# Patient Record
Sex: Female | Born: 1975 | Race: White | Hispanic: No | Marital: Married | Smoking: Never smoker
Health system: Southern US, Community
[De-identification: ages and names within clinical notes are randomized; demographics above are authoritative.]

## PROBLEM LIST (undated history)

## (undated) ENCOUNTER — Emergency Department (HOSPITAL_COMMUNITY): Disposition: A | Discharge status: Home / Self Care | Payer: BC Managed Care – PPO

## (undated) DIAGNOSIS — J189 Pneumonia, unspecified organism: Secondary | ICD-10-CM

## (undated) DIAGNOSIS — R112 Nausea with vomiting, unspecified: Secondary | ICD-10-CM

## (undated) DIAGNOSIS — Z8489 Family history of other specified conditions: Secondary | ICD-10-CM

## (undated) DIAGNOSIS — F419 Anxiety disorder, unspecified: Secondary | ICD-10-CM

## (undated) DIAGNOSIS — N6452 Nipple discharge: Secondary | ICD-10-CM

## (undated) DIAGNOSIS — Z98811 Dental restoration status: Secondary | ICD-10-CM

## (undated) DIAGNOSIS — Z9889 Other specified postprocedural states: Secondary | ICD-10-CM

## (undated) HISTORY — PX: APPENDECTOMY: SHX54

## (undated) HISTORY — PX: CHOLECYSTECTOMY: SHX55

## (undated) HISTORY — PX: ABDOMINAL HYSTERECTOMY: SHX81

---

## 1998-03-12 ENCOUNTER — Other Ambulatory Visit: Admission: RE | Admit: 1998-03-12 | Discharge: 1998-03-12 | Payer: Self-pay | Admitting: *Deleted

## 1999-05-04 ENCOUNTER — Other Ambulatory Visit: Admission: RE | Admit: 1999-05-04 | Discharge: 1999-05-04 | Payer: Self-pay | Admitting: *Deleted

## 1999-10-21 ENCOUNTER — Other Ambulatory Visit: Admission: RE | Admit: 1999-10-21 | Discharge: 1999-10-21 | Payer: Self-pay | Admitting: *Deleted

## 2005-08-09 HISTORY — PX: APPENDECTOMY: SHX54

## 2008-01-26 ENCOUNTER — Inpatient Hospital Stay (HOSPITAL_COMMUNITY): Admission: AD | Admit: 2008-01-26 | Discharge: 2008-01-28 | Payer: Self-pay | Admitting: Obstetrics and Gynecology

## 2009-08-14 ENCOUNTER — Ambulatory Visit (HOSPITAL_COMMUNITY): Admission: RE | Admit: 2009-08-14 | Discharge: 2009-08-14 | Payer: Self-pay | Admitting: Obstetrics and Gynecology

## 2009-08-14 HISTORY — PX: TUBAL LIGATION: SHX77

## 2009-08-14 HISTORY — PX: DILATION AND EVACUATION: SHX1459

## 2010-08-09 DIAGNOSIS — C801 Malignant (primary) neoplasm, unspecified: Secondary | ICD-10-CM

## 2010-08-09 HISTORY — DX: Malignant (primary) neoplasm, unspecified: C80.1

## 2010-10-25 LAB — CBC
MCHC: 34.6 g/dL (ref 30.0–36.0)
MCV: 88.4 fL (ref 78.0–100.0)
Platelets: 276 10*3/uL (ref 150–400)

## 2011-05-06 LAB — CBC
Hemoglobin: 11.6 — ABNORMAL LOW
Platelets: 267
RBC: 4.03
RDW: 14.9
RDW: 14.9
WBC: 9.8

## 2011-05-06 LAB — RPR: RPR Ser Ql: NONREACTIVE

## 2011-11-22 ENCOUNTER — Encounter (HOSPITAL_COMMUNITY): Payer: Self-pay

## 2011-11-22 ENCOUNTER — Emergency Department (HOSPITAL_COMMUNITY)
Admission: EM | Admit: 2011-11-22 | Discharge: 2011-11-22 | Disposition: A | Discharge status: Home / Self Care | Payer: BC Managed Care – PPO | Attending: Emergency Medicine | Admitting: Emergency Medicine

## 2011-11-22 DIAGNOSIS — Z9889 Other specified postprocedural states: Secondary | ICD-10-CM | POA: Insufficient documentation

## 2011-11-22 DIAGNOSIS — L0390 Cellulitis, unspecified: Secondary | ICD-10-CM

## 2011-11-22 DIAGNOSIS — L02419 Cutaneous abscess of limb, unspecified: Secondary | ICD-10-CM | POA: Insufficient documentation

## 2011-11-22 LAB — BASIC METABOLIC PANEL
BUN: 8 mg/dL (ref 6–23)
CO2: 23 mEq/L (ref 19–32)
Chloride: 106 mEq/L (ref 96–112)
GFR calc non Af Amer: >90 mL/min (ref >90)
Glucose, Bld: 75 mg/dL (ref 70–99)
Potassium: 3.4 mEq/L — ABNORMAL LOW (ref 3.5–5.1)
Sodium: 142 mEq/L (ref 135–145)

## 2011-11-22 LAB — DIFFERENTIAL
Eosinophils Absolute: 0.2 10*3/uL (ref 0.0–0.7)
Lymphocytes Relative: 30 % (ref 12–46)
Lymphs Abs: 2 10*3/uL (ref 0.7–4.0)
Monocytes Relative: 5 % (ref 3–12)
Neutrophils Relative %: 61 % (ref 43–77)

## 2011-11-22 LAB — CBC
Hemoglobin: 13.4 g/dL (ref 12.0–15.0)
MCH: 29.5 pg (ref 26.0–34.0)
RBC: 4.55 MIL/uL (ref 3.87–5.11)
WBC: 6.5 10*3/uL (ref 4.0–10.5)

## 2011-11-22 MED ORDER — DOXYCYCLINE HYCLATE 100 MG PO CAPS: 100.0000 mg | ORAL_CAPSULE | Freq: Two times a day (BID) | ORAL | Status: AC

## 2011-11-22 MED ORDER — OXYCODONE-ACETAMINOPHEN 5-325 MG PO TABS
1.0000 | ORAL_TABLET | Freq: Once | ORAL | Status: AC
  Administered 2011-11-22: 1 via ORAL
  Filled 2011-11-22: qty 1

## 2011-11-22 MED ORDER — OXYCODONE-ACETAMINOPHEN 5-325 MG PO TABS: 1.0000 | ORAL_TABLET | ORAL | Status: AC | PRN

## 2011-11-22 NOTE — ED Notes (Signed)
Patient presents with cellulitis to left leg that has been present for several weeks. 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 Patient reports the cellulitis is now oozing from the bandage.

## 2011-11-22 NOTE — Discharge Instructions (Signed)
Cellulitis Cellulitis is an infection of the tissue under the skin. The infected area is usually red and tender. This is caused by germs. These germs enter the body through cuts or sores. This usually happens in the arms or lower legs. HOME CARE   Take your medicine as told. Finish it even if you start to feel better.   If the infection is on the arm or leg, keep it raised (elevated).   Use a warm cloth on the infected area several times a day.   See your doctor for a follow-up visit as told.  GET HELP RIGHT AWAY IF:   You are tired or confused.   You throw up (vomit).   You have watery poop (diarrhea).   You feel ill and have muscle aches.   You have a fever.  MAKE SURE YOU:   Understand these instructions.   Will watch your condition.   Will get help right away if you are not doing well or get worse.  Document Released: 01/12/2008 Document Revised: 07/15/2011 Document Reviewed: 06/27/2009 Exit[employer] Patient Information 2012 [employer]. 

## 2011-11-22 NOTE — ED Notes (Signed)
Pt states that she had a sot of  Skin cancer removed on 10/14/11 dr Orvan Falconer in [place] and developed cellulitis has been taking antibiotics and was wrapped in antibitotic gauze which she had appointment to have taken off this afternoon. But pt states that she has  Started to feel bad having h/a and stomach feels bad

## 2011-11-22 NOTE — ED Provider Notes (Signed)
History     CSN: [id number]  Arrival date & time 11/22/11  1038   First MD Initiated Contact with Patient 11/22/11 1144      Chief Complaint  Patient presents with  . Cellulitis    The history is provided by the patient.  cellulitis Onset - several weeks ago Duration - constant Course - unchanged Worsened by - palpation Improved by - rest Associated symptoms - denies fever  Pt had melanoma surgical removed from left calf over one month ago.  One week ago ,she had the wound reopened due to "stitches not absorbed" by the dermatologist, and she has been on bactrim intermittently for past month Today, she reports she noticed yellowish drainage from the wound No redness reported She is able to ambulate No vomiting No other complaints at this time  Past Medical History  Diagnosis Date  . Cellulitis     Past Surgical History  Procedure Date  . Appendectomy   . Cholecystectomy   . Tubal ligation   . Melanoma excision to left calf    No family history on file.  History  Substance Use Topics  . Smoking status: Never Smoker   . Smokeless tobacco: Not on file  . Alcohol Use: No    OB History    Grav Para Term Preterm Abortions TAB SAB Ect Mult Living                  Review of Systems  Constitutional: Negative for fever.  Gastrointestinal: Negative for vomiting.  All other systems reviewed and are negative.    Allergies  Codeine  Home Medications   Current Outpatient Rx  Name Route Sig Dispense Refill  . FLUOXETINE HCL 20 MG PO CAPS Oral Take 20 mg by mouth daily.    Marland Kitchen DOXYCYCLINE HYCLATE 100 MG PO CAPS Oral Take 1 capsule (100 mg total) by mouth 2 (two) times daily. 10 capsule 0  . OXYCODONE-ACETAMINOPHEN 5-325 MG PO TABS Oral Take 1 tablet by mouth every 4 (four) hours as needed for pain. 10 tablet 0    BP 134/83  Pulse 74  Temp 97.7 F (36.5 C)  Resp 18  SpO2 100%  LMP 11/15/2011  Physical Exam CONSTITUTIONAL: Well developed/well  nourished HEAD AND FACE: Normocephalic/atraumatic EYES: EOMI ENMT: Mucous membranes moist NECK: supple no meningeal signs LUNGS, no apparent distress ABDOMEN: soft NEURO: Pt is awake/alert, moves all extremitiesx4 EXTREMITIES: pulses normal, full ROM SKIN: warm, color normal.  She has a wound noted to left calf midway down limb.  There is no surrounding erythema/drainage/crepitance noted.  The wound is well healed with appropriate granulation tissue noted surrounding wound.  No LE edema is noted.   PSYCH: no abnormalities of mood noted  ED Course  Procedures   Labs Reviewed  BASIC METABOLIC PANEL - Abnormal; Notable for the following:    Potassium 3.4 (*)    All other components within normal limits  CBC  DIFFERENTIAL  WOUND CULTURE     1. Cellulitis    Pt well appearing, no signs of worsening cellulitis No complicating abscess noted She was supposed to see dermatologist today but got nervous due to yellowish drainage (none noted in the ED) and so she came to ED Advised to stop bactrim, will start  5 days of doxycycline.  She will see her dermatologist later this week   MDM  Nursing notes reviewed and considered in documentation All labs/vitals reviewed and considered  Joya Gaskins, MD 11/22/11 1401

## 2011-11-25 LAB — WOUND CULTURE: Culture: NO GROWTH

## 2012-02-28 ENCOUNTER — Other Ambulatory Visit: Payer: Self-pay | Admitting: Obstetrics and Gynecology

## 2012-03-16 ENCOUNTER — Other Ambulatory Visit: Payer: Self-pay | Admitting: Dermatology

## 2013-08-23 ENCOUNTER — Other Ambulatory Visit: Payer: Self-pay | Admitting: Obstetrics and Gynecology

## 2014-09-05 ENCOUNTER — Other Ambulatory Visit: Payer: Self-pay | Admitting: Obstetrics and Gynecology

## 2014-09-06 LAB — CYTOLOGY - PAP

## 2015-10-30 ENCOUNTER — Other Ambulatory Visit: Payer: Self-pay | Admitting: Obstetrics and Gynecology

## 2015-10-31 LAB — CYTOLOGY - PAP

## 2016-11-10 ENCOUNTER — Ambulatory Visit (INDEPENDENT_AMBULATORY_CARE_PROVIDER_SITE_OTHER): Payer: BC Managed Care – PPO | Admitting: Orthopaedic Surgery

## 2016-11-10 ENCOUNTER — Encounter (INDEPENDENT_AMBULATORY_CARE_PROVIDER_SITE_OTHER): Payer: Self-pay | Admitting: Orthopaedic Surgery

## 2016-11-10 VITALS — BP 110/68 | HR 72 | Resp 14 | Ht 69.0 in | Wt 225.0 lb

## 2016-11-10 DIAGNOSIS — R2231 Localized swelling, mass and lump, right upper limb: Secondary | ICD-10-CM | POA: Diagnosis not present

## 2016-11-10 NOTE — Progress Notes (Signed)
   Office Visit Note   Patient: Sandra Vargas           Date of Birth: 06/28/1976           MRN: [id number] Visit Date: 11/10/2016              Requested by: No referring provider defined for this encounter. PCP: Default, Provider, MD   Assessment & Plan: Visit Diagnoses: Mass dorsum of right distal forearm/ wrist  Plan: Long discussion regarding potential diagnosis of the mass. Treatment options are to watch it a little longer or consider surgical excision. After much discussion the patient would prefer to have it excised. I believe the mass is solid and could represent a foreign body reaction versus benign tumor  Follow-Up Instructions: No Follow-up on file.   Orders:  No orders of the defined types were placed in this encounter.  No orders of the defined types were placed in this encounter.     Procedures: No procedures performed   Clinical Data: No additional findings.   Subjective: Chief Complaint  Patient presents with  . Right Wrist - Cyst     Sandra Vargas is a 41 year old female that presents with a cyst on the top of the right wrist. She relates the cyst has been there about 3 weeks and is getting larger and hurts more. Denies injury, fever or a bite.  Review of Systems  no history of insect bite, splinter or other foreign body. Denies numbness or tingling.   Physical Exam  Ortho Exam 5-6 mm mass is localized on the dorsum of the right distal forearm and wrist. It appears to be solid rather than cystic. Slightly red and tender to touch. Negative Tinel's .not associated with deeper tendons. Mass appears to be superficial .  Specialty Comments:  No specialty comments available.  Imaging: No results found.   PMFS History: There are no active problems to display for this patient.  Past Medical History:  Diagnosis Date  . Cellulitis     No family history on file.  Past Surgical History:  Procedure Laterality Date  . APPENDECTOMY    .  CHOLECYSTECTOMY    . MELANOMA EXCISION  to left calf  . TUBAL LIGATION     Social History   Occupational History  . Not on file.   Social History Main Topics  . Smoking status: Never Smoker  . Smokeless tobacco: Not on file  . Alcohol use No  . Drug use: No  . Sexual activity: Not on file

## 2016-11-18 ENCOUNTER — Other Ambulatory Visit: Payer: Self-pay

## 2016-11-18 ENCOUNTER — Other Ambulatory Visit: Payer: Self-pay | Admitting: Obstetrics and Gynecology

## 2016-11-18 DIAGNOSIS — R2231 Localized swelling, mass and lump, right upper limb: Secondary | ICD-10-CM | POA: Diagnosis not present

## 2016-11-18 HISTORY — PX: WRIST MASS EXCISION: SHX2674

## 2016-11-19 ENCOUNTER — Other Ambulatory Visit: Payer: Self-pay | Admitting: Obstetrics and Gynecology

## 2016-11-19 DIAGNOSIS — N6452 Nipple discharge: Secondary | ICD-10-CM

## 2016-11-19 LAB — CYTOLOGY - PAP

## 2016-11-24 ENCOUNTER — Ambulatory Visit (INDEPENDENT_AMBULATORY_CARE_PROVIDER_SITE_OTHER): Payer: BC Managed Care – PPO | Admitting: Orthopaedic Surgery

## 2016-11-24 ENCOUNTER — Encounter (INDEPENDENT_AMBULATORY_CARE_PROVIDER_SITE_OTHER): Payer: Self-pay | Admitting: Orthopaedic Surgery

## 2016-11-24 VITALS — BP 170/95 | HR 61 | Ht 69.0 in | Wt 225.0 lb

## 2016-11-24 DIAGNOSIS — R2231 Localized swelling, mass and lump, right upper limb: Secondary | ICD-10-CM

## 2016-11-24 NOTE — Progress Notes (Signed)
   Office Visit Note   Patient: Sandra Vargas           Date of Birth: 07/02/1976           MRN: [id number] Visit Date: 11/24/2016              Requested by: No referring provider defined for this encounter. PCP: Default, Provider, MD   Assessment & Plan: Visit Diagnoses:  1. Mass of right wrist   6 days status post excision of mass from the dorsum of the right wrist. She report consistent with a pilomatricoma.  Plan: Activity as tolerated with right hand and wrist, office 2 weeks. No splinting.  Follow-Up Instructions: Return in about 2 weeks (around 12/08/2016).   Orders:  No orders of the defined types were placed in this encounter.  No orders of the defined types were placed in this encounter.     Procedures: No procedures performed   Clinical Data: No additional findings.   Subjective: Chief Complaint  Patient presents with  . Right Wrist - Routine Post Op, Cyst  Sandra Vargas is 6 days status post excision of a mass from her right wrist. 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 She is doing well without any complaints  HPI  Review of Systems   Objective: Vital Signs: BP (!) 170/95   Pulse 61   Ht 5\' 9"  (1.753 m)   Wt 225 lb (102.1 kg)   BMI 33.23 kg/m   Physical Exam  Ortho Exam right wrist exam without evidence of bleeding or drainage. No redness. Neurovascular exam intact.  Specialty Comments:  No specialty comments available.  Imaging: No results found.   PMFS History: There are no active problems to display for this patient.  Past Medical History:  Diagnosis Date  . Cellulitis     History reviewed. No pertinent family history.  Past Surgical History:  Procedure Laterality Date  . APPENDECTOMY    . CHOLECYSTECTOMY    . MELANOMA EXCISION  to left calf  . TUBAL LIGATION     Social History   Occupational History  . Not on file.   Social History Main Topics  . Smoking status: Never Smoker  .  Smokeless tobacco: Never Used  . Alcohol use No  . Drug use: No  . Sexual activity: Yes    Birth control/ protection: IUD

## 2016-11-26 ENCOUNTER — Ambulatory Visit
Admission: RE | Admit: 2016-11-26 | Discharge: 2016-11-26 | Disposition: A | Discharge status: Home / Self Care | Payer: BC Managed Care – PPO | Source: Ambulatory Visit | Attending: Obstetrics and Gynecology | Admitting: Obstetrics and Gynecology

## 2016-11-26 DIAGNOSIS — N6452 Nipple discharge: Secondary | ICD-10-CM

## 2016-12-08 ENCOUNTER — Inpatient Hospital Stay (INDEPENDENT_AMBULATORY_CARE_PROVIDER_SITE_OTHER): Payer: BC Managed Care – PPO | Admitting: Orthopaedic Surgery

## 2016-12-15 ENCOUNTER — Ambulatory Visit (INDEPENDENT_AMBULATORY_CARE_PROVIDER_SITE_OTHER): Payer: BC Managed Care – PPO | Admitting: Orthopaedic Surgery

## 2016-12-15 ENCOUNTER — Encounter (INDEPENDENT_AMBULATORY_CARE_PROVIDER_SITE_OTHER): Payer: Self-pay | Admitting: Orthopaedic Surgery

## 2016-12-15 DIAGNOSIS — L989 Disorder of the skin and subcutaneous tissue, unspecified: Secondary | ICD-10-CM | POA: Insufficient documentation

## 2016-12-15 NOTE — Progress Notes (Signed)
   Office Visit Note   Patient: Sandra Vargas           Date of Birth: 05-28-1976           MRN: [id number] Visit Date: 12/15/2016              Requested by: No referring provider defined for this encounter. PCP: Default, Provider, MD   Assessment & Plan: Visit Diagnoses:  1. Skin lesion of right arm     Plan: #1: Continue to protect this area from the sun #2: Use of mederma cream or vitamin E cream   Follow-Up Instructions: Return if symptoms worsen or fail to improve.   Orders:  No orders of the defined types were placed in this encounter.  No orders of the defined types were placed in this encounter.     Procedures: No procedures performed   Clinical Data: No additional findings.   Subjective: Chief Complaint  Patient presents with  . Right Wrist - Routine Post Op    Sandra Vargas is a 41 y o status post Right wrist surgery, removal of cyst. She relates she has tenderness and numbness at the incision site. No fevers or chill    Sandra Vargas is 41 year old white female who is now 4 weeks status post excision of a lesion of her right radial portion of her wrist. She is doing well. Denies any pain at this time. Very happy with the results.    Review of Systems   Objective: Vital Signs: BP 124/85   Pulse 85   Ht 5\' 10"  (1.778 m)   Wt 225 lb (102.1 kg)   BMI 32.28 kg/m   Physical Exam  Ortho Exam  Wound is well-healed. Still some puckering from the subcutaneous stitches. There is flattening though in the central portion. No signs of infection.  Specialty Comments:  No specialty comments available.  Imaging: No results found.   PMFS History: Patient Active Problem List   Diagnosis Date Noted  . Skin lesion of right arm 12/15/2016   Past Medical History:  Diagnosis Date  . Cellulitis     Family History  Problem Relation Age of Onset  . Breast cancer Maternal Aunt     Past Surgical History:  Procedure Laterality Date  . APPENDECTOMY      . CHOLECYSTECTOMY    . MELANOMA EXCISION  to left calf  . TUBAL LIGATION     Social History   Occupational History  . Not on file.   Social History Main Topics  . Smoking status: Never Smoker  . Smokeless tobacco: Never Used  . Alcohol use No  . Drug use: No  . Sexual activity: Yes    Birth control/ protection: IUD

## 2017-12-13 ENCOUNTER — Other Ambulatory Visit: Payer: Self-pay | Admitting: Obstetrics and Gynecology

## 2017-12-13 DIAGNOSIS — N6452 Nipple discharge: Secondary | ICD-10-CM

## 2017-12-23 ENCOUNTER — Ambulatory Visit: Admission: RE | Admit: 2017-12-23 | Payer: BC Managed Care – PPO | Source: Ambulatory Visit

## 2017-12-23 ENCOUNTER — Ambulatory Visit
Admission: RE | Admit: 2017-12-23 | Discharge: 2017-12-23 | Disposition: A | Discharge status: Home / Self Care | Payer: BC Managed Care – PPO | Source: Ambulatory Visit | Attending: Obstetrics and Gynecology | Admitting: Obstetrics and Gynecology

## 2017-12-23 DIAGNOSIS — N6452 Nipple discharge: Secondary | ICD-10-CM

## 2018-01-03 ENCOUNTER — Ambulatory Visit: Payer: Self-pay | Admitting: Surgery

## 2018-01-03 NOTE — H&P (Signed)
Franco Collet Documented: 01/03/2018 10:18 AM Location: [facility] Patient #: [id number] DOB: 17-Jul-1976 Married / Language: Sandra Vargas / Race: White Female  History of Present Illness Marcello Moores A. Raeghan Demeter MD; 01/03/2018 10:46 AM) Patient words: Patient sent at the request of Dr. Radford Pax due to right breast nipple discharge which is clear 1 year. The patient states his discharge is moderate in intensity. The patient denies any pain and its clear nonbloody. Patient denies any mass, pain, redness, fever, or chills associated with the right nipple discharge. There is no left breast mass or left nipple discharge. She had 2 maternal aunts with breast cancer in their 33s. She has had no previous breast biopsies.  The patient is a 42 year old female.   Past Surgical History ([name], [place]; 01/03/2018 10:18 AM) Appendectomy Foot Surgery Right. Gallbladder Surgery - Laparoscopic Oral Surgery  Diagnostic Studies History ([name], [place]; 01/03/2018 10:18 AM) Colonoscopy never Mammogram within last year Pap Smear 1-5 years ago  Allergies ([name], [place]; 01/03/2018 10:19 AM) Codeine Phosphate *ANALGESICS - OPIOID* Nausea, Vomiting. Allergies Reconciled  Medication History ([name], [place]; 01/03/2018 10:19 AM) Phentermine HCl (37.5MG  Capsule, Oral) Active. FLUoxetine HCl (20MG  Capsule, Oral) Active. Mirena (52 MG) (20MCG/24HR IUD, Intrauterine) Active. Medications Reconciled  Pregnancy / Birth History ([name], RMA; 01/03/2018 10:18 AM) Age at menarche 89 years. Contraceptive History Intrauterine device. Gravida 4 Irregular periods Maternal age 68-25 Para 72  Other Problems ([name], [place]; 01/03/2018 10:18 AM) Anxiety Disorder Back Pain Depression Melanoma     Review of Systems ([name]; 01/03/2018 10:18 AM) General Not Present- Appetite Loss, Chills, Fatigue, Fever, Night Sweats, Weight  Gain and Weight Loss. Skin Not Present- Change in Wart/Mole, Dryness, Hives, Jaundice, New Lesions, Non-Healing Wounds, Rash and Ulcer. HEENT Present- Wears glasses/contact lenses. Not Present- Earache, Hearing Loss, Hoarseness, Nose Bleed, Oral Ulcers, Ringing in the Ears, Seasonal Allergies, Sinus Pain, Sore Throat, Visual Disturbances and Yellow Eyes. Respiratory Not Present- Bloody sputum, Chronic Cough, Difficulty Breathing, Snoring and Wheezing. Breast Present- Nipple Discharge. Not Present- Breast Mass, Breast Pain and Skin Changes. Cardiovascular Not Present- Chest Pain, Difficulty Breathing Lying Down, Leg Cramps, Palpitations, Rapid Heart Rate, Shortness of Breath and Swelling of Extremities. Gastrointestinal Not Present- Abdominal Pain, Bloating, Bloody Stool, Change in Bowel Habits, Chronic diarrhea, Constipation, Difficulty Swallowing, Excessive gas, Gets full quickly at meals, Hemorrhoids, Indigestion, Nausea, Rectal Pain and Vomiting. Female Genitourinary Not Present- Frequency, Nocturia, Painful Urination, Pelvic Pain and Urgency. Musculoskeletal Not Present- Back Pain, Joint Pain, Joint Stiffness, Muscle Pain, Muscle Weakness and Swelling of Extremities. Neurological Not Present- Decreased Memory, Fainting, Headaches, Numbness, Seizures, Tingling, Tremor, Trouble walking and Weakness. Psychiatric Present- Anxiety and Depression. Not Present- Bipolar, Change in Sleep Pattern, Fearful and Frequent crying. Endocrine Not Present- Cold Intolerance, Excessive Hunger, Hair Changes, Heat Intolerance, Hot flashes and New Diabetes. Hematology Not Present- Blood Thinners, Easy Bruising, Excessive bleeding, Gland problems, HIV and Persistent Infections.  Vitals ([name]; 01/03/2018 10:19 AM) 01/03/2018 10:18 AM Weight: 222 lb Height: 69in Body Surface Area: 2.16 m Body Mass Index: 32.78 kg/m  Temp.: 98.68F  Pulse: 104 (Regular)  BP: 126/84 (Sitting, Left Arm,  Standard)      Physical Exam (Deslyn Cavenaugh A. Brace Welte MD; 01/03/2018 10:47 AM)  General Mental Status-Alert. General Appearance-Consistent with stated age. Hydration-Well hydrated. Voice-Normal.  Head and Neck Head-normocephalic, atraumatic with no lesions or palpable masses. Trachea-midline. Thyroid Gland Characteristics - normal size and consistency.  Eye Eyeball - Bilateral-Extraocular movements intact. Sclera/Conjunctiva - Bilateral-No scleral icterus.  Chest and Lung Exam Chest and lung exam reveals -quiet, even and easy respiratory effort with no use of accessory muscles and on auscultation, normal breath sounds, no adventitious sounds and normal vocal resonance. Inspection Chest Wall - Normal. Back - normal.  Breast Breast - Left-Symmetric, Non Tender, No Biopsy scars, no Dimpling, No Inflammation, No Lumpectomy scars, No Mastectomy scars, No Peau d' Orange. Breast - Right-Symmetric, Non Tender, No Biopsy scars, no Dimpling, No Inflammation, No Lumpectomy scars, No Mastectomy scars, No Peau d' Orange. Breast Lump-No Palpable Breast Mass.  Cardiovascular Cardiovascular examination reveals -normal heart sounds, regular rate and rhythm with no murmurs and normal pedal pulses bilaterally.  Abdomen Inspection Inspection of the abdomen reveals - No Hernias. Skin - Scar - no surgical scars. Palpation/Percussion Palpation and Percussion of the abdomen reveal - Soft, Non Tender, No Rebound tenderness, No Rigidity (guarding) and No hepatosplenomegaly. Auscultation Auscultation of the abdomen reveals - Bowel sounds normal.  Neurologic Neurologic evaluation reveals -alert and oriented x 3 with no impairment of recent or remote memory. Mental Status-Normal.  Musculoskeletal Normal Exam - Left-Upper Extremity Strength Normal and Lower Extremity Strength Normal. Normal Exam - Right-Upper Extremity Strength Normal and Lower Extremity Strength  Normal.  Lymphatic Head & Neck  General Head & Neck Lymphatics: Bilateral - Description - Normal. Axillary  General Axillary Region: Bilateral - Description - Normal. Tenderness - Non Tender. Femoral & Inguinal  Generalized Femoral & Inguinal Lymphatics: Bilateral - Description - Normal. Tenderness - Non Tender.    Assessment & Plan (Ashlin Hidalgo A. Lossie Kalp MD; 01/03/2018 10:47 AM)  DISCHARGE FROM RIGHT NIPPLE (N64.52) Impression: MRI to complete evaluation given family history   she will consider central duct excsion on the right Risk of lumpectomy include bleeding, infection, seroma, more surgery, nipple loss/ numbness wound care, cosmetic deformity and the need for other treatments, death , blood clots, death. Pt agrees to proceed.  Current Plans The anatomy and the physiology was discussed. The pathophysiology and natural history of the disease was discussed. Options were discussed and recommendations were made. Technique, risks, benefits, & alternatives were discussed. Risks such as stroke, heart attack, bleeding, indection, death, and other risks discussed. Questions answered. The patient agrees to proceed. Pt Education - [facility] Free Text Education/Instructions: discussed with patient and provided information.  FAMILY HISTORY OF BREAST CANCER (Z80.3) Impression: she has a lifetime risk of 20 percent of developing breast cancer may bebefit from high risk clinic

## 2018-01-09 ENCOUNTER — Other Ambulatory Visit: Payer: Self-pay | Admitting: Surgery

## 2018-01-09 DIAGNOSIS — N6452 Nipple discharge: Secondary | ICD-10-CM

## 2018-01-14 ENCOUNTER — Ambulatory Visit
Admission: RE | Admit: 2018-01-14 | Discharge: 2018-01-14 | Disposition: A | Discharge status: Home / Self Care | Payer: BC Managed Care – PPO | Source: Ambulatory Visit | Attending: Surgery | Admitting: Surgery

## 2018-01-14 DIAGNOSIS — N6452 Nipple discharge: Secondary | ICD-10-CM

## 2018-01-14 MED ORDER — GADOBENATE DIMEGLUMINE 529 MG/ML IV SOLN
20.0000 mL | Freq: Once | INTRAVENOUS | Status: AC | PRN
  Administered 2018-01-14: 20 mL via INTRAVENOUS

## 2018-01-20 ENCOUNTER — Other Ambulatory Visit: Payer: Self-pay | Admitting: Surgery

## 2018-01-20 DIAGNOSIS — R9389 Abnormal findings on diagnostic imaging of other specified body structures: Secondary | ICD-10-CM

## 2018-01-23 ENCOUNTER — Ambulatory Visit
Admission: RE | Admit: 2018-01-23 | Discharge: 2018-01-23 | Disposition: A | Discharge status: Home / Self Care | Payer: BC Managed Care – PPO | Source: Ambulatory Visit | Attending: Surgery | Admitting: Surgery

## 2018-01-23 DIAGNOSIS — R9389 Abnormal findings on diagnostic imaging of other specified body structures: Secondary | ICD-10-CM

## 2018-01-23 MED ORDER — GADOBENATE DIMEGLUMINE 529 MG/ML IV SOLN
20.0000 mL | Freq: Once | INTRAVENOUS | Status: AC | PRN
  Administered 2018-01-23: 20 mL via INTRAVENOUS

## 2018-02-10 ENCOUNTER — Ambulatory Visit: Payer: Self-pay | Admitting: Surgery

## 2018-02-10 NOTE — H&P (Signed)
Sandra Vargas Documented: 02/10/2018 8:33 AM Location: [facility] Patient #: [id number] DOB: Mar 20, 1976 Married / Language: Sandra Vargas / Race: White Female  History of Present Illness Sandra Moores A. Mayur Duman MD; 02/10/2018 10:02 AM) Patient words: Patient returns after MRI imaging for right breast nipple discharge. She and her core biopsy of a centrally located lesion which came back as fibrocystic disease. She still continues to have significant right breast nipple discharge which is clear in nature. It is causing issues with hygiene. She is interested in undergoing right breast central duct excision for control of symptoms.  The patient is a 42 year old female.   Allergies (Sandra Vargas, [place]; 02/10/2018 8:34 AM) Codeine Phosphate *ANALGESICS - OPIOID* Nausea, Vomiting. Allergies Reconciled  Medication History (Sandra Vargas, [place]; 02/10/2018 8:34 AM) Phentermine HCl (37.5MG  Capsule, Oral) Active. FLUoxetine HCl (20MG  Capsule, Oral) Active. Mirena (52 MG) (20MCG/24HR IUD, Intrauterine) Active. Medications Reconciled    Vitals (Sandra Vargas; 02/10/2018 8:34 AM) 02/10/2018 8:33 AM Weight: 221.4 lb Height: 69in Body Surface Area: 2.16 m Body Mass Index: 32.69 kg/m  Temp.: 98.5F  Pulse: 83 (Regular)  BP: 128/86 (Sitting, Left Arm, Standard)      Physical Exam (Sandra Stecher A. Serai Tukes MD; 02/10/2018 10:03 AM)  General Mental Status-Alert. General Appearance-Consistent with stated age. Hydration-Well hydrated. Voice-Normal.  Breast Note: Serious right breast discharge noted. No masses. Swelling in her right nipple noted from biopsy.  Neurologic Neurologic evaluation reveals -alert and oriented x 3 with no impairment of recent or remote memory. Mental Status-Normal.  Lymphatic Axillary  General Axillary Region: Bilateral - Description - Normal. Tenderness - Non Tender.    Assessment & Plan (Sandra Bonner A. Nollie Shiflett MD; 02/10/2018 10:03  AM)  DISCHARGE FROM RIGHT NIPPLE (N64.52) Impression: central duct excsion on the right Risk of lumpectomy include bleeding, infection, seroma, more surgery, nipple loss/ numbness wound care, cosmetic deformity and the need for other treatments, death , blood clots, death. Pt agrees to proceed.  Current Plans Pt Education - [facility] Free Text Education/Instructions: discussed with patient and provided information. Pt Education - [facility] Breast Biopsy HCI: discussed with patient and provided information.

## 2018-03-07 ENCOUNTER — Encounter (HOSPITAL_BASED_OUTPATIENT_CLINIC_OR_DEPARTMENT_OTHER): Payer: Self-pay | Admitting: *Deleted

## 2018-03-07 ENCOUNTER — Other Ambulatory Visit: Payer: Self-pay

## 2018-03-07 NOTE — Progress Notes (Signed)
Ensure pre surgery drink given to patient as well as instructions. Pt verbalized understanding.

## 2018-03-07 NOTE — Pre-Procedure Instructions (Signed)
To come pick up Ensure pre-surgery drink 10 oz. - to drink by 0400 DOS.

## 2018-03-14 ENCOUNTER — Ambulatory Visit (HOSPITAL_BASED_OUTPATIENT_CLINIC_OR_DEPARTMENT_OTHER): Payer: BC Managed Care – PPO | Admitting: Certified Registered"

## 2018-03-14 ENCOUNTER — Ambulatory Visit (HOSPITAL_BASED_OUTPATIENT_CLINIC_OR_DEPARTMENT_OTHER)
Admission: RE | Admit: 2018-03-14 | Discharge: 2018-03-14 | Disposition: A | Discharge status: Home / Self Care | Payer: BC Managed Care – PPO | Source: Ambulatory Visit | Attending: Surgery | Admitting: Surgery

## 2018-03-14 ENCOUNTER — Encounter (HOSPITAL_BASED_OUTPATIENT_CLINIC_OR_DEPARTMENT_OTHER): Payer: Self-pay | Admitting: *Deleted

## 2018-03-14 ENCOUNTER — Other Ambulatory Visit: Payer: Self-pay

## 2018-03-14 ENCOUNTER — Encounter (HOSPITAL_BASED_OUTPATIENT_CLINIC_OR_DEPARTMENT_OTHER)
Admission: RE | Disposition: A | Discharge status: Home / Self Care | Payer: Self-pay | Source: Ambulatory Visit | Attending: Surgery

## 2018-03-14 DIAGNOSIS — Z803 Family history of malignant neoplasm of breast: Secondary | ICD-10-CM | POA: Insufficient documentation

## 2018-03-14 DIAGNOSIS — D241 Benign neoplasm of right breast: Secondary | ICD-10-CM | POA: Insufficient documentation

## 2018-03-14 DIAGNOSIS — N6452 Nipple discharge: Secondary | ICD-10-CM | POA: Insufficient documentation

## 2018-03-14 HISTORY — DX: Other specified postprocedural states: Z98.890

## 2018-03-14 HISTORY — DX: Anxiety disorder, unspecified: F41.9

## 2018-03-14 HISTORY — DX: Nausea with vomiting, unspecified: R11.2

## 2018-03-14 HISTORY — DX: Family history of other specified conditions: Z84.89

## 2018-03-14 HISTORY — PX: BREAST DUCTAL SYSTEM EXCISION: SHX5242

## 2018-03-14 HISTORY — DX: Dental restoration status: Z98.811

## 2018-03-14 HISTORY — DX: Nipple discharge: N64.52

## 2018-03-14 SURGERY — EXCISION DUCTAL SYSTEM BREAST
Anesthesia: General | Site: Breast | Laterality: Right

## 2018-03-14 MED ORDER — FENTANYL CITRATE (PF) 100 MCG/2ML IJ SOLN
50.0000 ug | INTRAMUSCULAR | Status: DC | PRN
  Administered 2018-03-14: 100 ug via INTRAVENOUS

## 2018-03-14 MED ORDER — BUPIVACAINE-EPINEPHRINE 0.25% -1:200000 IJ SOLN
INTRAMUSCULAR | Status: DC | PRN
  Administered 2018-03-14: 10 mL

## 2018-03-14 MED ORDER — LACTATED RINGERS IV SOLN
INTRAVENOUS | Status: DC
  Administered 2018-03-14: 07:00:00 via INTRAVENOUS

## 2018-03-14 MED ORDER — DEXTROSE 5 % IV SOLN: 3.0000 g | INTRAVENOUS | Status: DC

## 2018-03-14 MED ORDER — EPHEDRINE SULFATE 50 MG/ML IJ SOLN
INTRAMUSCULAR | Status: DC | PRN
  Administered 2018-03-14: 10 mg via INTRAVENOUS

## 2018-03-14 MED ORDER — GABAPENTIN 300 MG PO CAPS: 300.0000 mg | ORAL_CAPSULE | ORAL | Status: DC

## 2018-03-14 MED ORDER — LIDOCAINE HCL (CARDIAC) PF 100 MG/5ML IV SOSY
PREFILLED_SYRINGE | INTRAVENOUS | Status: DC | PRN
  Administered 2018-03-14: 80 mg via INTRAVENOUS

## 2018-03-14 MED ORDER — CELECOXIB 200 MG PO CAPS
ORAL_CAPSULE | ORAL | Status: AC
  Filled 2018-03-14: qty 1

## 2018-03-14 MED ORDER — GABAPENTIN 300 MG PO CAPS
ORAL_CAPSULE | ORAL | Status: AC
  Filled 2018-03-14: qty 1

## 2018-03-14 MED ORDER — PROMETHAZINE HCL 25 MG/ML IJ SOLN: 6.2500 mg | INTRAMUSCULAR | Status: DC | PRN

## 2018-03-14 MED ORDER — ONDANSETRON HCL 4 MG/2ML IJ SOLN
INTRAMUSCULAR | Status: DC | PRN
  Administered 2018-03-14: 4 mg via INTRAVENOUS

## 2018-03-14 MED ORDER — SCOPOLAMINE 1 MG/3DAYS TD PT72
MEDICATED_PATCH | TRANSDERMAL | Status: AC
  Filled 2018-03-14: qty 1

## 2018-03-14 MED ORDER — GABAPENTIN 300 MG PO CAPS
300.0000 mg | ORAL_CAPSULE | ORAL | Status: AC
  Administered 2018-03-14: 300 mg via ORAL

## 2018-03-14 MED ORDER — FENTANYL CITRATE (PF) 100 MCG/2ML IJ SOLN
INTRAMUSCULAR | Status: AC
  Filled 2018-03-14: qty 2

## 2018-03-14 MED ORDER — CHLORHEXIDINE GLUCONATE CLOTH 2 % EX PADS: 6.0000 | MEDICATED_PAD | Freq: Once | CUTANEOUS | Status: DC

## 2018-03-14 MED ORDER — ONDANSETRON HCL 4 MG/2ML IJ SOLN
INTRAMUSCULAR | Status: AC
  Filled 2018-03-14: qty 2

## 2018-03-14 MED ORDER — PROPOFOL 500 MG/50ML IV EMUL
INTRAVENOUS | Status: AC
  Filled 2018-03-14: qty 50

## 2018-03-14 MED ORDER — ACETAMINOPHEN 500 MG PO TABS
ORAL_TABLET | ORAL | Status: AC
  Filled 2018-03-14: qty 2

## 2018-03-14 MED ORDER — FENTANYL CITRATE (PF) 100 MCG/2ML IJ SOLN: 25.0000 ug | INTRAMUSCULAR | Status: DC | PRN

## 2018-03-14 MED ORDER — IBUPROFEN 800 MG PO TABS: 800.0000 mg | ORAL_TABLET | Freq: Three times a day (TID) | ORAL | 0 refills | Status: DC | PRN

## 2018-03-14 MED ORDER — ACETAMINOPHEN 500 MG PO TABS: 1000.0000 mg | ORAL_TABLET | ORAL | Status: DC

## 2018-03-14 MED ORDER — DIPHENHYDRAMINE HCL 50 MG/ML IJ SOLN
INTRAMUSCULAR | Status: DC | PRN
  Administered 2018-03-14: 12.5 mg via INTRAVENOUS

## 2018-03-14 MED ORDER — DEXAMETHASONE SODIUM PHOSPHATE 4 MG/ML IJ SOLN
INTRAMUSCULAR | Status: DC | PRN
  Administered 2018-03-14: 10 mg via INTRAVENOUS

## 2018-03-14 MED ORDER — CELECOXIB 200 MG PO CAPS: 200.0000 mg | ORAL_CAPSULE | ORAL | Status: DC

## 2018-03-14 MED ORDER — MIDAZOLAM HCL 2 MG/2ML IJ SOLN
1.0000 mg | INTRAMUSCULAR | Status: DC | PRN
  Administered 2018-03-14: 2 mg via INTRAVENOUS

## 2018-03-14 MED ORDER — ACETAMINOPHEN 500 MG PO TABS
1000.0000 mg | ORAL_TABLET | ORAL | Status: AC
  Administered 2018-03-14: 1000 mg via ORAL

## 2018-03-14 MED ORDER — CEFAZOLIN SODIUM-DEXTROSE 2-4 GM/100ML-% IV SOLN
INTRAVENOUS | Status: AC
Start: 2018-03-14 — End: ?
  Filled 2018-03-14: qty 100

## 2018-03-14 MED ORDER — MIDAZOLAM HCL 2 MG/2ML IJ SOLN
INTRAMUSCULAR | Status: AC
  Filled 2018-03-14: qty 2

## 2018-03-14 MED ORDER — DEXAMETHASONE SODIUM PHOSPHATE 10 MG/ML IJ SOLN
INTRAMUSCULAR | Status: AC
  Filled 2018-03-14: qty 1

## 2018-03-14 MED ORDER — DEXTROSE 5 % IV SOLN
3.0000 g | INTRAVENOUS | Status: AC
  Administered 2018-03-14: 2 g via INTRAVENOUS

## 2018-03-14 MED ORDER — SCOPOLAMINE 1 MG/3DAYS TD PT72
1.0000 | MEDICATED_PATCH | Freq: Once | TRANSDERMAL | Status: DC | PRN
  Administered 2018-03-14: 1.5 mg via TRANSDERMAL

## 2018-03-14 MED ORDER — PROPOFOL 10 MG/ML IV BOLUS
INTRAVENOUS | Status: DC | PRN
  Administered 2018-03-14: 180 mg via INTRAVENOUS

## 2018-03-14 MED ORDER — CELECOXIB 200 MG PO CAPS
200.0000 mg | ORAL_CAPSULE | ORAL | Status: AC
  Administered 2018-03-14: 200 mg via ORAL

## 2018-03-14 MED ORDER — BUPIVACAINE-EPINEPHRINE (PF) 0.25% -1:200000 IJ SOLN
INTRAMUSCULAR | Status: AC
  Filled 2018-03-14: qty 30

## 2018-03-14 SURGICAL SUPPLY — 48 items
ADH SKN CLS APL DERMABOND .7 (GAUZE/BANDAGES/DRESSINGS) ×1
APPLIER CLIP 9.375 MED OPEN (MISCELLANEOUS)
APR CLP MED 9.3 20 MLT OPN (MISCELLANEOUS)
BLADE SURG 15 STRL LF DISP TIS (BLADE) ×1 IMPLANT
BLADE SURG 15 STRL SS (BLADE) ×3
CANISTER SUCT 1200ML W/VALVE (MISCELLANEOUS) ×3 IMPLANT
CHLORAPREP W/TINT 26ML (MISCELLANEOUS) ×3 IMPLANT
CLIP APPLIE 9.375 MED OPEN (MISCELLANEOUS) IMPLANT
CLIP VESOCCLUDE SM WIDE 6/CT (CLIP) IMPLANT
COVER BACK TABLE 60X90IN (DRAPES) ×3 IMPLANT
COVER MAYO STAND STRL (DRAPES) ×3 IMPLANT
DECANTER SPIKE VIAL GLASS SM (MISCELLANEOUS) ×3 IMPLANT
DERMABOND ADVANCED (GAUZE/BANDAGES/DRESSINGS) ×2
DERMABOND ADVANCED .7 DNX12 (GAUZE/BANDAGES/DRESSINGS) ×1 IMPLANT
DEVICE DUBIN W/COMP PLATE 8390 (MISCELLANEOUS) IMPLANT
DRAPE LAPAROSCOPIC ABDOMINAL (DRAPES) IMPLANT
DRAPE LAPAROTOMY 100X72 PEDS (DRAPES) ×3 IMPLANT
DRAPE UTILITY XL STRL (DRAPES) ×3 IMPLANT
ELECT COATED BLADE 2.86 ST (ELECTRODE) ×3 IMPLANT
ELECT REM PT RETURN 9FT ADLT (ELECTROSURGICAL) ×3
ELECTRODE REM PT RTRN 9FT ADLT (ELECTROSURGICAL) ×1 IMPLANT
GLOVE BIO SURGEON STRL SZ 6.5 (GLOVE) ×1 IMPLANT
GLOVE BIO SURGEONS STRL SZ 6.5 (GLOVE) ×1
GLOVE BIOGEL PI IND STRL 6.5 (GLOVE) IMPLANT
GLOVE BIOGEL PI IND STRL 8 (GLOVE) ×1 IMPLANT
GLOVE BIOGEL PI INDICATOR 6.5 (GLOVE) ×2
GLOVE BIOGEL PI INDICATOR 8 (GLOVE) ×2
GLOVE ECLIPSE 8.0 STRL XLNG CF (GLOVE) ×3 IMPLANT
GOWN STRL REUS W/ TWL LRG LVL3 (GOWN DISPOSABLE) ×2 IMPLANT
GOWN STRL REUS W/TWL LRG LVL3 (GOWN DISPOSABLE) ×6
KIT MARKER MARGIN INK (KITS) IMPLANT
NDL HYPO 25X1 1.5 SAFETY (NEEDLE) ×1 IMPLANT
NEEDLE HYPO 25X1 1.5 SAFETY (NEEDLE) ×3 IMPLANT
NS IRRIG 1000ML POUR BTL (IV SOLUTION) ×3 IMPLANT
PACK BASIN DAY SURGERY FS (CUSTOM PROCEDURE TRAY) ×3 IMPLANT
PENCIL BUTTON HOLSTER BLD 10FT (ELECTRODE) ×3 IMPLANT
SLEEVE SCD COMPRESS KNEE MED (MISCELLANEOUS) ×3 IMPLANT
SPONGE LAP 4X18 RFD (DISPOSABLE) ×3 IMPLANT
STAPLER VISISTAT 35W (STAPLE) IMPLANT
SUT MON AB 4-0 PC3 18 (SUTURE) ×3 IMPLANT
SUT SILK 2 0 SH (SUTURE) IMPLANT
SUT VICRYL 3-0 CR8 SH (SUTURE) ×3 IMPLANT
SYR CONTROL 10ML LL (SYRINGE) ×3 IMPLANT
TOWEL GREEN STERILE FF (TOWEL DISPOSABLE) ×6 IMPLANT
TOWEL OR NON WOVEN STRL DISP B (DISPOSABLE) ×3 IMPLANT
TUBE CONNECTING 20'X1/4 (TUBING) ×1
TUBE CONNECTING 20X1/4 (TUBING) ×2 IMPLANT
YANKAUER SUCT BULB TIP NO VENT (SUCTIONS) ×3 IMPLANT

## 2018-03-14 NOTE — Anesthesia Postprocedure Evaluation (Signed)
Anesthesia Post Note  Patient: Sandra Vargas  Procedure(s) Performed: RIGHT BREAST CENTRAL DUCT EXCISION ERAS PATHWAY (Right Breast)     Patient location during evaluation: PACU Anesthesia Type: General Level of consciousness: awake and alert Pain management: pain level controlled Vital Signs Assessment: post-procedure vital signs reviewed and stable Respiratory status: spontaneous breathing, nonlabored ventilation and respiratory function stable Cardiovascular status: blood pressure returned to baseline and stable Postop Assessment: no apparent nausea or vomiting Anesthetic complications: no    Last Vitals:  Vitals:   03/14/18 0845 03/14/18 0900  BP: 130/83 (!) 151/89  Pulse: 85 71  Resp: 15 16  Temp:  36.6 C  SpO2: 100% 100%    Last Pain:  Vitals:   03/14/18 0900  TempSrc:   PainSc: 0-No pain                 Catalina Gravel

## 2018-03-14 NOTE — Op Note (Signed)
Preoperative diagnosis: Right breast nipple discharge  Postoperative diagnosis: Same  Procedure: Right breast central duct excision  Surgeon: Erroll Luna, MD  Anesthesia: LMA with local  EBL: Minimal  Drains: None  IV fluids: Per anesthesia record  Specimen: Right breast tissue containing central duct verified by placement of lacrimal probe  Indications for procedure: The patient is a 42 year old female with a clear right breast nipple discharge that is problematic.  General work-up with mammogram and MRI and a biopsy was done of the area which showed fibrocystic change.  Of note, there is a large dilated central duct at 6:00.  I discussed nonoperative management of this problem.  She was having significant clear nipple discharge.  I explained her this is benign and observation is an option here.  This was causing swelling of her clothing therefore she desired excision.  I discussed the risk of surgery with her.  Risk of bleeding, infection, nipple loss, cosmetic deformity, blood clot, stroke, the need for revisional surgery, cosmetic changes to her breast, the inability to breast-feed from his breast, and the need further treatments and/or procedures.  She voiced understanding agreed to proceed.  Description of procedure: The patient was met in the holding area.  I reviewed her preoperative films and MRI.  Questions were answered.  She was taken back to the operating room.  She was placed supine upon the operating table.  After induction of LMA anesthesia, the right breast was prepped and draped in sterile fashion she received appropriate preoperative antibiotics and timeout was done.  0.25% bupivacaine with epinephrine was used in the lateral border of the nipple areolar complex was anesthetized.  Curvilinear incision was made on the lateral  border of the nipple areolar complex.  A lacrimal probe was used to identify the duct.  This corresponded to the 6:00 lesion and extended for 4 cm.   Dissection was carried into the nipple.  Identified the probe.  The duct was identified and grasped with a clamp.  All tissue on the duct was excised for approximately 3 to 4 cm in a caudal direction.  This was removed and sent to pathology.  Hemostasis was achieved.  The deep tissues of the breasts were closed with 3-0 Vicryl.  3-0 Vicryl was used to reapproximate the nipple areolar complex.  4-0 Monocryl was used to close the skin.  Dermabond applied.  All final counts were found to be correct.  The nipple was viable within the case.  She was placed in a breast binder.  She was awoke extubated taken recovery in satisfactory condition.

## 2018-03-14 NOTE — H&P (Signed)
Franco Collet  Location: [facility] Patient #: [id number] DOB: 07-24-1976 Married / Language: English / Race: White Female  History of Present Illness  Patient words: Patient sent at the request of Dr. Radford Pax due to right breast nipple discharge which is clear 1 year. The patient states his discharge is moderate in intensity. The patient denies any pain and its clear nonbloody. Patient denies any mass, pain, redness, fever, or chills associated with the right nipple discharge. There is no left breast mass or left nipple discharge. She had 2 maternal aunts with breast cancer in their 7s. She has had no previous breast biopsies.  The patient is a 42 year old female.   Past Surgical History  Appendectomy Foot Surgery Right. Gallbladder Surgery - Laparoscopic Oral Surgery  Diagnostic Studies History  Colonoscopy never Mammogram within last year Pap Smear 1-5 years ago  Allergies  Codeine Phosphate *ANALGESICS - OPIOID* Nausea, Vomiting. Allergies Reconciled  Medication History  Phentermine HCl (37.5MG  Capsule, Oral) Active. FLUoxetine HCl (20MG  Capsule, Oral) Active. Mirena (52 MG) (20MCG/24HR IUD, Intrauterine) Active. Medications Reconciled  Pregnancy / Birth History  Age at menarche 71 years. Contraceptive History Intrauterine device. Gravida 4 Irregular periods Maternal age 8-25 Para 3  Other Problems  Anxiety Disorder Back Pain Depression Melanoma     Review of Systems  General Not Present- Appetite Loss, Chills, Fatigue, Fever, Night Sweats, Weight Gain and Weight Loss. Skin Not Present- Change in Wart/Mole, Dryness, Hives, Jaundice, New Lesions, Non-Healing Wounds, Rash and Ulcer. HEENT Present- Wears glasses/contact lenses. Not Present- Earache, Hearing Loss, Hoarseness, Nose Bleed, Oral Ulcers, Ringing in the Ears, Seasonal Allergies, Sinus Pain, Sore Throat, Visual Disturbances and Yellow  Eyes. Respiratory Not Present- Bloody sputum, Chronic Cough, Difficulty Breathing, Snoring and Wheezing. Breast Present- Nipple Discharge. Not Present- Breast Mass, Breast Pain and Skin Changes. Cardiovascular Not Present- Chest Pain, Difficulty Breathing Lying Down, Leg Cramps, Palpitations, Rapid Heart Rate, Shortness of Breath and Swelling of Extremities. Gastrointestinal Not Present- Abdominal Pain, Bloating, Bloody Stool, Change in Bowel Habits, Chronic diarrhea, Constipation, Difficulty Swallowing, Excessive gas, Gets full quickly at meals, Hemorrhoids, Indigestion, Nausea, Rectal Pain and Vomiting. Female Genitourinary Not Present- Frequency, Nocturia, Painful Urination, Pelvic Pain and Urgency. Musculoskeletal Not Present- Back Pain, Joint Pain, Joint Stiffness, Muscle Pain, Muscle Weakness and Swelling of Extremities. Neurological Not Present- Decreased Memory, Fainting, Headaches, Numbness, Seizures, Tingling, Tremor, Trouble walking and Weakness. Psychiatric Present- Anxiety and Depression. Not Present- Bipolar, Change in Sleep Pattern, Fearful and Frequent crying. Endocrine Not Present- Cold Intolerance, Excessive Hunger, Hair Changes, Heat Intolerance, Hot flashes and New Diabetes. Hematology Not Present- Blood Thinners, Easy Bruising, Excessive bleeding, Gland problems, HIV and Persistent Infections.  Vitals  01/03/2018 10:18 AM Weight: 222 lb Height: 69in Body Surface Area: 2.16 m Body Mass Index: 32.78 kg/m  Temp.: 98.66F  Pulse: 104 (Regular)  BP: 126/84 (Sitting, Left Arm, Standard)      Physical Exam   General Mental Status-Alert. General Appearance-Consistent with stated age. Hydration-Well hydrated. Voice-Normal.  Head and Neck Head-normocephalic, atraumatic with no lesions or palpable masses. Trachea-midline. Thyroid Gland Characteristics - normal size and consistency.  Eye Eyeball - Bilateral-Extraocular movements  intact. Sclera/Conjunctiva - Bilateral-No scleral icterus.  Chest and Lung Exam Chest and lung exam reveals -quiet, even and easy respiratory effort with no use of accessory muscles and on auscultation, normal breath sounds, no adventitious sounds and normal vocal resonance. Inspection Chest Wall - Normal. Back - normal.  Breast Breast - Left-Symmetric, Non Tender, No  Biopsy scars, no Dimpling, No Inflammation, No Lumpectomy scars, No Mastectomy scars, No Peau d' Orange. Breast - Right-Symmetric, Non Tender, No Biopsy scars, no Dimpling, No Inflammation, No Lumpectomy scars, No Mastectomy scars, No Peau d' Orange. Breast Lump-No Palpable Breast Mass.  Cardiovascular Cardiovascular examination reveals -normal heart sounds, regular rate and rhythm with no murmurs and normal pedal pulses bilaterally.  Abdomen Inspection Inspection of the abdomen reveals - No Hernias. Skin - Scar - no surgical scars. Palpation/Percussion Palpation and Percussion of the abdomen reveal - Soft, Non Tender, No Rebound tenderness, No Rigidity (guarding) and No hepatosplenomegaly. Auscultation Auscultation of the abdomen reveals - Bowel sounds normal.  Neurologic Neurologic evaluation reveals -alert and oriented x 3 with no impairment of recent or remote memory. Mental Status-Normal.  Musculoskeletal Normal Exam - Left-Upper Extremity Strength Normal and Lower Extremity Strength Normal. Normal Exam - Right-Upper Extremity Strength Normal and Lower Extremity Strength Normal.  Lymphatic Head & Neck  General Head & Neck Lymphatics: Bilateral - Description - Normal. Axillary  General Axillary Region: Bilateral - Description - Normal. Tenderness - Non Tender. Femoral & Inguinal  Generalized Femoral & Inguinal Lymphatics: Bilateral - Description - Normal. Tenderness - Non Tender.    Assessment & Plan  DISCHARGE FROM RIGHT NIPPLE [phone]) Impression: MRI to complete  evaluation given family history   she will consider central duct excsion on the right Risk of lumpectomy include bleeding, infection, seroma, more surgery, nipple loss/ numbness wound care, cosmetic deformity and the need for other treatments, death , blood clots, death. Pt agrees to proceed.  Current Plans The anatomy and the physiology was discussed. The pathophysiology and natural history of the disease was discussed. Options were discussed and recommendations were made. Technique, risks, benefits, & alternatives were discussed. Risks such as stroke, heart attack, bleeding, indection, death, and other risks discussed. Questions answered. The patient agrees to proceed. Pt Education - [facility] Free Text Education/Instructions: discussed with patient and provided information.  FAMILY HISTORY OF BREAST CANCER (Z80.3) Impression: she has a lifetime risk of 20 percent of developing breast cancer may bebefit from high risk clinic          E

## 2018-03-14 NOTE — Discharge Instructions (Signed)
[facility] °Office Phone Number [phone] ° °BREAST BIOPSY/ PARTIAL MASTECTOMY: POST OP INSTRUCTIONS ° °Always review your discharge instruction sheet given to you by the facility where your surgery was performed. ° °IF YOU HAVE DISABILITY OR FAMILY LEAVE FORMS, YOU MUST BRING THEM TO THE OFFICE FOR PROCESSING.  DO NOT GIVE THEM TO YOUR DOCTOR. ° °1. A prescription for pain medication may be given to you upon discharge.  Take your pain medication as prescribed, if needed.  If narcotic pain medicine is not needed, then you may take acetaminophen (Tylenol) or ibuprofen (Advil) as needed. °2. Take your usually prescribed medications unless otherwise directed °3. If you need a refill on your pain medication, please contact your pharmacy.  They will contact our office to request authorization.  Prescriptions will not be filled after 5pm or on week-ends. °4. You should eat very light the first 24 hours after surgery, such as soup, crackers, pudding, etc.  Resume your normal diet the day after surgery. °5. Most patients will experience some swelling and bruising in the breast.  Ice packs and a good support bra will help.  Swelling and bruising can take several days to resolve.  °6. It is common to experience some constipation if taking pain medication after surgery.  Increasing fluid intake and taking a stool softener will usually help or prevent this problem from occurring.  A mild laxative (Milk of Magnesia or Miralax) should be taken according to package directions if there are no bowel movements after 48 hours. °7. Unless discharge instructions indicate otherwise, you may remove your bandages 24-48 hours after surgery, and you may shower at that time.  You may have steri-strips (small skin tapes) in place directly over the incision.  These strips should be left on the skin for 7-10 days.  If your surgeon used skin glue on the incision, you may shower in 24 hours.  The glue will flake off over the  next 2-3 weeks.  Any sutures or staples will be removed at the office during your follow-up visit. °8. ACTIVITIES:  You may resume regular daily activities (gradually increasing) beginning the next day.  Wearing a good support bra or sports bra minimizes pain and swelling.  You may have sexual intercourse when it is comfortable. °a. You may drive when you no longer are taking prescription pain medication, you can comfortably wear a seatbelt, and you can safely maneuver your car and apply brakes. °b. RETURN TO WORK:  ______________________________________________________________________________________ °9. You should see your doctor in the office for a follow-up appointment approximately two weeks after your surgery.  Your doctor’s nurse will typically make your follow-up appointment when she calls you with your pathology report.  Expect your pathology report 2-3 business days after your surgery.  You may call to check if you do not hear from us after three days. °10. OTHER INSTRUCTIONS: _______________________________________________________________________________________________ _____________________________________________________________________________________________________________________________________ °_____________________________________________________________________________________________________________________________________ °_____________________________________________________________________________________________________________________________________ ° °WHEN TO CALL YOUR DOCTOR: °1. Fever over 101.0 °2. Nausea and/or vomiting. °3. Extreme swelling or bruising. °4. Continued bleeding from incision. °5. Increased pain, redness, or drainage from the incision. ° °The clinic staff is available to answer your questions during regular business hours.  Please don’t hesitate to call and ask to speak to one of the nurses for clinical concerns.  If you have a medical emergency, go to the nearest  emergency room or call 911.  A surgeon from [facility] is always on call at the hospital. ° °For further questions, please visit [email or web address]  ° ° ° ° °  Post Anesthesia Home Care Instructions ° °Activity: °Get plenty of rest for the remainder of the day. A responsible individual must stay with you for 24 hours following the procedure.  °For the next 24 hours, DO NOT: °-Drive a car °-Operate machinery °-Drink alcoholic beverages °-Take any medication unless instructed by your physician °-Make any legal decisions or sign important papers. ° °Meals: °Start with liquid foods such as gelatin or soup. Progress to regular foods as tolerated. Avoid greasy, spicy, heavy foods. If nausea and/or vomiting occur, drink only clear liquids until the nausea and/or vomiting subsides. Call your physician if vomiting continues. ° °Special Instructions/Symptoms: °Your throat may feel dry or sore from the anesthesia or the breathing tube placed in your throat during surgery. If this causes discomfort, gargle with warm salt water. The discomfort should disappear within 24 hours. ° °If you had a scopolamine patch placed behind your ear for the management of post- operative nausea and/or vomiting: ° °1. The medication in the patch is effective for 72 hours, after which it should be removed.  Wrap patch in a tissue and discard in the trash. Wash hands thoroughly with soap and water. °2. You may remove the patch earlier than 72 hours if you experience unpleasant side effects which may include dry mouth, dizziness or visual disturbances. °3. Avoid touching the patch. Wash your hands with soap and water after contact with the patch. °  ° °

## 2018-03-14 NOTE — Anesthesia Procedure Notes (Signed)
Procedure Name: LMA Insertion Date/Time: 03/14/2018 7:32 AM Performed by: Maryella Shivers, CRNA Pre-anesthesia Checklist: Patient identified, Emergency Drugs available, Suction available and Patient being monitored Patient Re-evaluated:Patient Re-evaluated prior to induction Oxygen Delivery Method: Circle system utilized Preoxygenation: Pre-oxygenation with 100% oxygen Induction Type: IV induction Ventilation: Mask ventilation without difficulty LMA: LMA inserted LMA Size: 4.0 Number of attempts: 1 Airway Equipment and Method: Bite block Placement Confirmation: positive ETCO2 Tube secured with: Tape Dental Injury: Teeth and Oropharynx as per pre-operative assessment

## 2018-03-14 NOTE — Transfer of Care (Signed)
Immediate Anesthesia Transfer of Care Note  Patient: Sandra Vargas  Procedure(s) Performed: RIGHT BREAST CENTRAL DUCT EXCISION ERAS PATHWAY (Right Breast)  Patient Location: PACU  Anesthesia Type:General  Level of Consciousness: sedated  Airway & Oxygen Therapy: Patient Spontanous Breathing and Patient connected to face mask oxygen  Post-op Assessment: Report given to RN and Post -op Vital signs reviewed and stable  Post vital signs: Reviewed and stable  Last Vitals:  Vitals Value Taken Time  BP 101/63 03/14/2018  8:13 AM  Temp    Pulse 57 03/14/2018  8:14 AM  Resp 15 03/14/2018  8:14 AM  SpO2 100 % 03/14/2018  8:14 AM  Vitals shown include unvalidated device data.  Last Pain:  Vitals:   03/14/18 0626  TempSrc: Oral  PainSc: 0-No pain         Complications: No apparent anesthesia complications

## 2018-03-14 NOTE — Anesthesia Preprocedure Evaluation (Addendum)
Anesthesia Evaluation  Patient identified by MRN, date of birth, ID band Patient awake    Reviewed: Allergy & Precautions, NPO status , Patient's Chart, lab work & pertinent test results  History of Anesthesia Complications (+) PONV, Family history of anesthesia reaction and history of anesthetic complications  Airway Mallampati: II  TM Distance: <3 FB Neck ROM: Full    Dental  (+) Teeth Intact, Dental Advisory Given, Caps   Pulmonary neg pulmonary ROS,    Pulmonary exam normal breath sounds clear to auscultation       Cardiovascular Exercise Tolerance: Good negative cardio ROS Normal cardiovascular exam Rhythm:Regular Rate:Normal     Neuro/Psych PSYCHIATRIC DISORDERS Anxiety negative neurological ROS     GI/Hepatic negative GI ROS, Neg liver ROS,   Endo/Other  Obesity   Renal/GU negative Renal ROS     Musculoskeletal negative musculoskeletal ROS (+)   Abdominal   Peds  Hematology negative hematology ROS (+)   Anesthesia Other Findings Day of surgery medications reviewed with the patient.  right breast nipple discharge  Reproductive/Obstetrics negative OB ROS                            Anesthesia Physical Anesthesia Plan  ASA: II  Anesthesia Plan: General   Post-op Pain Management:    Induction: Intravenous  PONV Risk Score and Plan: 4 or greater and Scopolamine patch - Pre-op, Midazolam, Dexamethasone, Ondansetron and Diphenhydramine  Airway Management Planned: LMA  Additional Equipment:   Intra-op Plan:   Post-operative Plan: Extubation in OR  Informed Consent: I have reviewed the patients History and Physical, chart, labs and discussed the procedure including the risks, benefits and alternatives for the proposed anesthesia with the patient or authorized representative who has indicated his/her understanding and acceptance.   Dental advisory given  Plan Discussed  with: CRNA  Anesthesia Plan Comments:        Anesthesia Quick Evaluation

## 2018-03-15 ENCOUNTER — Encounter (HOSPITAL_BASED_OUTPATIENT_CLINIC_OR_DEPARTMENT_OTHER): Payer: Self-pay | Admitting: Surgery

## 2018-10-16 ENCOUNTER — Encounter: Payer: Self-pay | Admitting: Podiatry

## 2018-10-16 ENCOUNTER — Ambulatory Visit (INDEPENDENT_AMBULATORY_CARE_PROVIDER_SITE_OTHER): Payer: BC Managed Care – PPO

## 2018-10-16 ENCOUNTER — Ambulatory Visit: Payer: BC Managed Care – PPO | Admitting: Podiatry

## 2018-10-16 DIAGNOSIS — M79672 Pain in left foot: Secondary | ICD-10-CM

## 2018-10-16 DIAGNOSIS — M79671 Pain in right foot: Secondary | ICD-10-CM

## 2018-10-16 DIAGNOSIS — N943 Premenstrual tension syndrome: Secondary | ICD-10-CM | POA: Insufficient documentation

## 2018-10-16 DIAGNOSIS — M722 Plantar fascial fibromatosis: Secondary | ICD-10-CM | POA: Diagnosis not present

## 2018-10-16 MED ORDER — MELOXICAM 15 MG PO TABS: 15.0000 mg | ORAL_TABLET | Freq: Every day | ORAL | 0 refills | Status: AC

## 2018-10-16 NOTE — Progress Notes (Signed)
Subjective:    Patient ID: Sandra Vargas, female    DOB: 07/12/1976, 43 y.o.   MRN: [id number]  HPI 43 year old female presents the office today for concerns of bilateral heel pain with the left side worse than the right.  She said the pain is becoming more consistent.  She denies any recent injury or trauma.  She denies any numbness or tingling.  The pain does not wake her up at night.  No swelling.  No other concerns today.   Review of Systems  Skin:       Right foot surgery done when 43 years old-Dr Berline Lopes?  All other systems reviewed and are negative.  Past Medical History:  Diagnosis Date  . Anxiety   . Dental crown present   . Discharge from right nipple 02/2018  . Family history of adverse reaction to anesthesia    pt's mother has hx. of post-op N/V  . PONV (postoperative nausea and vomiting)     Past Surgical History:  Procedure Laterality Date  . APPENDECTOMY    . BREAST DUCTAL SYSTEM EXCISION Right 03/14/2018   Procedure: RIGHT BREAST CENTRAL DUCT EXCISION ERAS PATHWAY;  Surgeon: Erroll Luna, MD;  Location: [facility];  Service: General;  Laterality: Right;  . CHOLECYSTECTOMY    . DILATION AND EVACUATION  08/14/2009  . TUBAL LIGATION  08/14/2009  . WRIST MASS EXCISION Right 11/18/2016     Current Outpatient Medications:  .  FLUoxetine (PROZAC) 20 MG capsule, Take 20 mg by mouth at bedtime. , Disp: , Rfl:  .  meloxicam (MOBIC) 15 MG tablet, Take 1 tablet (15 mg total) by mouth daily., Disp: 30 tablet, Rfl: 0  Allergies  Allergen Reactions  . Codeine Nausea And Vomiting        Objective:   Physical Exam General: AAO x3, NAD  Dermatological: Skin is warm, dry and supple bilateral. Nails x 10 are well manicured; remaining integument appears unremarkable at this time. There are no open sores, no preulcerative lesions, no rash or signs of infection present.  Vascular: Dorsalis Pedis artery and Posterior Tibial artery pedal pulses are  2/4 bilateral with immedate capillary fill time. There is no pain with calf compression, swelling, warmth, erythema.   Neruologic: Grossly intact via light touch bilateral.  Protective threshold with Semmes Wienstein monofilament intact to all pedal sites bilateral.  Negative Tinel sign  Musculoskeletal: Tenderness to palpation along the plantar medial tubercle of the calcaneus at the insertion of plantar fascia on the left and right foot. There is no pain along the course of the plantar fascia within the arch of the foot. Plantar fascia appears to be intact. There is no pain with lateral compression of the calcaneus or pain with vibratory sensation. There is no pain along the course or insertion of the achilles tendon. No other areas of tenderness to bilateral lower extremities.  Gait: Unassisted, Nonantalgic.     Assessment & Plan:  43 year old female with bilateral heel pain, plantar fasciitis -Treatment options discussed including all alternatives, risks, and complications -Etiology of symptoms were discussed -X-rays were obtained and reviewed with the patient.  No evidence of acute fracture or stress fracture identified today. -Steroid injections performed.  See procedure note below. -Prescribed mobic. Discussed side effects of the medication and directed to stop if any are to occur and call the office.  -Plantar fascial braces dispensed. -Discussed stretching, ice exercises daily. -Discussed shoe modifications and orthotics.  Procedure: Injection Tendon/Ligament Discussed alternatives, risks, complications  and verbal consent was obtained.  Location: LEFT and RIGHT plantar fascia at the glabrous junction; medial approach. Skin Prep: Alcohol Injectate: 0.5cc 0.5% marcaine plain, 0.5 cc 2% lidocaine plain and, 1 cc kenalog 10. Disposition: Patient tolerated procedure well. Injection site dressed with a band-aid.  Post-injection care was discussed and return precautions discussed.    Trula Slade DPM

## 2018-10-16 NOTE — Patient Instructions (Signed)

## 2018-10-26 DIAGNOSIS — M722 Plantar fascial fibromatosis: Secondary | ICD-10-CM | POA: Insufficient documentation

## 2018-11-06 ENCOUNTER — Other Ambulatory Visit: Payer: Self-pay

## 2018-11-06 ENCOUNTER — Ambulatory Visit: Payer: BC Managed Care – PPO | Admitting: Podiatry

## 2018-11-06 ENCOUNTER — Encounter: Payer: Self-pay | Admitting: Podiatry

## 2018-11-06 VITALS — Temp 98.2°F

## 2018-11-06 DIAGNOSIS — M722 Plantar fascial fibromatosis: Secondary | ICD-10-CM

## 2018-11-06 MED ORDER — TRIAMCINOLONE ACETONIDE 10 MG/ML IJ SUSP
10.0000 mg | Freq: Once | INTRAMUSCULAR | Status: AC
  Administered 2018-11-06: 10 mg

## 2018-11-07 NOTE — Progress Notes (Signed)
Subjective: 43 year old female presents the office today for Evaluation of bilateral heel pain, plantar fasciitis.  Overall she states that she is doing better.  She having no pain on the right side.  On the left side she does state that she still gets some discomfort but overall is improved.  She has been stretching, icing as well as using the brace.  She also is very happy he is been able to run over the last 7 days.  She does not get pain with running but she does get up afterwards at times.  But overall is improved. Denies any systemic complaints such as fevers, chills, nausea, vomiting. No acute changes since last appointment, and no other complaints at this time.   Objective: AAO x3, NAD DP/PT pulses palpable bilaterally, CRT less than 3 seconds There is improved.  Mild tenderness to the left plantar medial tubercle of the calcaneus at insertion of plantar fascia on the left side.  No pain on the right side.  No pain with lateral compression of calcaneus bilaterally.  No pain on the Achilles tendon.  No edema, erythema.  Negative Tinel sign. No open lesions or pre-ulcerative lesions.  No pain with calf compression, swelling, warmth, erythema  Assessment: Improving bilateral heel pain-pain left side although improved  Plan: -All treatment options discussed with the patient including all alternatives, risks, complications.  -Steroid injection performed on left side.  See procedure note below.  Encouraged to continue with stretching, ice exercises daily as well as shoe modifications and inserts. -Patient encouraged to call the office with any questions, concerns, change in symptoms.   Procedure: Injection Tendon/Ligament Discussed alternatives, risks, complications and verbal consent was obtained.  Location: LEFT plantar fascia at the glabrous junction; medial approach. Skin Prep: Alcohol  Injectate: 0.5cc 0.5% marcaine plain, 0.5 cc 2% lidocaine plain and, 1 cc kenalog 10. Disposition:  Patient tolerated procedure well. Injection site dressed with a band-aid.  Post-injection care was discussed and return precautions discussed.   Sandra Vargas DPM

## 2018-12-04 ENCOUNTER — Ambulatory Visit: Payer: BC Managed Care – PPO | Admitting: Podiatry

## 2018-12-04 ENCOUNTER — Other Ambulatory Visit: Payer: Self-pay

## 2018-12-04 DIAGNOSIS — M722 Plantar fascial fibromatosis: Secondary | ICD-10-CM | POA: Diagnosis not present

## 2018-12-04 MED ORDER — TRIAMCINOLONE ACETONIDE 10 MG/ML IJ SUSP
10.0000 mg | Freq: Once | INTRAMUSCULAR | Status: AC
  Administered 2018-12-04: 10 mg

## 2018-12-05 NOTE — Progress Notes (Signed)
Subjective: 43 year old female presents the office today for evaluation of bilateral heel pain, plantar fasciitis.  She states that overall she is doing better about 75% but she still getting pain to the bottom of the left heel.  The right side is doing well.  She still doing the stretching, icing, using a plantar fascial braces.  The injections have been helpful.  Denies any recent injury or changes in the last saw her. Denies any systemic complaints such as fevers, chills, nausea, vomiting. No acute changes since last appointment, and no other complaints at this time.   Objective: AAO x3, NAD DP/PT pulses palpable bilaterally, CRT less than 3 seconds There is still mild tenderness to the left plantar medial tubercle of the calcaneus at insertion of plantar fascia on the left side.  No pain on the right side.  No pain with lateral compression of calcaneus bilaterally.  No pain on the Achilles tendon.  No edema, erythema.  Negative Tinel sign. Equinus is present and she still gets pain when dorsiflexing the ankle is worse with pulling sensation of the heel. No open lesions or pre-ulcerative lesions.  No pain with calf compression, swelling, warmth, erythema  Assessment: Improving bilateral heel pain-pain left side although improved  Plan: -All treatment options discussed with the patient including all alternatives, risks, complications.  -Steroid injection performed on left side.  See procedure note below.   -Night splint dispensed.  -Encouraged to continue with stretching, ice exercises daily as well as shoe modifications and inserts. -Patient encouraged to call the office with any questions, concerns, change in symptoms.   Procedure: Injection Tendon/Ligament Discussed alternatives, risks, complications and verbal consent was obtained.  Location: LEFT plantar fascia at the glabrous junction; medial approach. Skin Prep: Alcohol  Injectate: 0.5cc 0.5% marcaine plain, 0.5 cc 2% lidocaine plain  and, 1 cc kenalog 10. Disposition: Patient tolerated procedure well. Injection site dressed with a band-aid.  Post-injection care was discussed and return precautions discussed.   Trula Slade DPM

## 2018-12-06 ENCOUNTER — Other Ambulatory Visit: Payer: Self-pay | Admitting: Podiatry

## 2018-12-06 ENCOUNTER — Encounter: Payer: Self-pay | Admitting: Podiatry

## 2018-12-06 MED ORDER — METHYLPREDNISOLONE 4 MG PO TBPK: ORAL_TABLET | ORAL | 0 refills | Status: DC

## 2018-12-06 NOTE — Progress Notes (Signed)
Medrol dose pack sent to pharmacy

## 2019-01-02 ENCOUNTER — Encounter: Payer: Self-pay | Admitting: Podiatry

## 2019-01-02 ENCOUNTER — Ambulatory Visit: Payer: BC Managed Care – PPO | Admitting: Podiatry

## 2019-01-02 ENCOUNTER — Other Ambulatory Visit: Payer: Self-pay

## 2019-01-02 VITALS — Temp 97.3°F

## 2019-01-02 DIAGNOSIS — M722 Plantar fascial fibromatosis: Secondary | ICD-10-CM

## 2019-01-02 MED ORDER — IBUPROFEN 600 MG PO TABS: 600.0000 mg | ORAL_TABLET | Freq: Three times a day (TID) | ORAL | 0 refills | Status: DC | PRN

## 2019-01-02 NOTE — Addendum Note (Signed)
Addended by: Cranford Mon R on: 01/02/2019 02:23 PM   Modules accepted: Orders

## 2019-01-02 NOTE — Patient Instructions (Addendum)
Plantar Fasciitis (Heel Spur Syndrome) with Rehab The plantar fascia is a fibrous, ligament-like, soft-tissue structure that spans the bottom of the foot. Plantar fasciitis is a condition that causes pain in the foot due to inflammation of the tissue. SYMPTOMS   Pain and tenderness on the underneath side of the foot.  Pain that worsens with standing or walking. CAUSES  Plantar fasciitis is caused by irritation and injury to the plantar fascia on the underneath side of the foot. Common mechanisms of injury include:  Direct trauma to bottom of the foot.  Damage to a small nerve that runs under the foot where the main fascia attaches to the heel bone.  Stress placed on the plantar fascia due to bone spurs. RISK INCREASES WITH:   Activities that place stress on the plantar fascia (running, jumping, pivoting, or cutting).  Poor strength and flexibility.  Improperly fitted shoes.  Tight calf muscles.  Flat feet.  Failure to warm-up properly before activity.  Obesity. PREVENTION  Warm up and stretch properly before activity.  Allow for adequate recovery between workouts.  Maintain physical fitness:  Strength, flexibility, and endurance.  Cardiovascular fitness.  Maintain a health body weight.  Avoid stress on the plantar fascia.  Wear properly fitted shoes, including arch supports for individuals who have flat feet.  PROGNOSIS  If treated properly, then the symptoms of plantar fasciitis usually resolve without surgery. However, occasionally surgery is necessary.  RELATED COMPLICATIONS   Recurrent symptoms that may result in a chronic condition.  Problems of the lower back that are caused by compensating for the injury, such as limping.  Pain or weakness of the foot during push-off following surgery.  Chronic inflammation, scarring, and partial or complete fascia tear, occurring more often from repeated injections.  TREATMENT  Treatment initially involves the  use of ice and medication to help reduce pain and inflammation. The use of strengthening and stretching exercises may help reduce pain with activity, especially stretches of the Achilles tendon. These exercises may be performed at home or with a therapist. Your caregiver may recommend that you use heel cups of arch supports to help reduce stress on the plantar fascia. Occasionally, corticosteroid injections are given to reduce inflammation. If symptoms persist for greater than 6 months despite non-surgical (conservative), then surgery may be recommended.   MEDICATION   If pain medication is necessary, then nonsteroidal anti-inflammatory medications, such as aspirin and ibuprofen, or other minor pain relievers, such as acetaminophen, are often recommended.  Do not take pain medication within 7 days before surgery.  Prescription pain relievers may be given if deemed necessary by your caregiver. Use only as directed and only as much as you need.  Corticosteroid injections may be given by your caregiver. These injections should be reserved for the most serious cases, because they may only be given a certain number of times.  HEAT AND COLD  Cold treatment (icing) relieves pain and reduces inflammation. Cold treatment should be applied for 10 to 15 minutes every 2 to 3 hours for inflammation and pain and immediately after any activity that aggravates your symptoms. Use ice packs or massage the area with a piece of ice (ice massage).  Heat treatment may be used prior to performing the stretching and strengthening activities prescribed by your caregiver, physical therapist, or athletic trainer. Use a heat pack or soak the injury in warm water.  SEEK IMMEDIATE MEDICAL CARE IF:  Treatment seems to offer no benefit, or the condition worsens.  Any medications   produce adverse side effects.  EXERCISES- RANGE OF MOTION (ROM) AND STRETCHING EXERCISES - Plantar Fasciitis (Heel Spur Syndrome) These exercises  may help you when beginning to rehabilitate your injury. Your symptoms may resolve with or without further involvement from your physician, physical therapist or athletic trainer. While completing these exercises, remember:   Restoring tissue flexibility helps normal motion to return to the joints. This allows healthier, less painful movement and activity.  An effective stretch should be held for at least 30 seconds.  A stretch should never be painful. You should only feel a gentle lengthening or release in the stretched tissue.  RANGE OF MOTION - Toe Extension, Flexion  Sit with your right / left leg crossed over your opposite knee.  Grasp your toes and gently pull them back toward the top of your foot. You should feel a stretch on the bottom of your toes and/or foot.  Hold this stretch for 10 seconds.  Now, gently pull your toes toward the bottom of your foot. You should feel a stretch on the top of your toes and or foot.  Hold this stretch for 10 seconds. Repeat  times. Complete this stretch 3 times per day.   RANGE OF MOTION - Ankle Dorsiflexion, Active Assisted  Remove shoes and sit on a chair that is preferably not on a carpeted surface.  Place right / left foot under knee. Extend your opposite leg for support.  Keeping your heel down, slide your right / left foot back toward the chair until you feel a stretch at your ankle or calf. If you do not feel a stretch, slide your bottom forward to the edge of the chair, while still keeping your heel down.  Hold this stretch for 10 seconds. Repeat 3 times. Complete this stretch 2 times per day.   STRETCH  Gastroc, Standing  Place hands on wall.  Extend right / left leg, keeping the front knee somewhat bent.  Slightly point your toes inward on your back foot.  Keeping your right / left heel on the floor and your knee straight, shift your weight toward the wall, not allowing your back to arch.  You should feel a gentle stretch  in the right / left calf. Hold this position for 10 seconds. Repeat 3 times. Complete this stretch 2 times per day.  STRETCH  Soleus, Standing  Place hands on wall.  Extend right / left leg, keeping the other knee somewhat bent.  Slightly point your toes inward on your back foot.  Keep your right / left heel on the floor, bend your back knee, and slightly shift your weight over the back leg so that you feel a gentle stretch deep in your back calf.  Hold this position for 10 seconds. Repeat 3 times. Complete this stretch 2 times per day.  STRETCH  Gastrocsoleus, Standing  Note: This exercise can place a lot of stress on your foot and ankle. Please complete this exercise only if specifically instructed by your caregiver.   Place the ball of your right / left foot on a step, keeping your other foot firmly on the same step.  Hold on to the wall or a rail for balance.  Slowly lift your other foot, allowing your body weight to press your heel down over the edge of the step.  You should feel a stretch in your right / left calf.  Hold this position for 10 seconds.  Repeat this exercise with a slight bend in your right /   left knee. Repeat 3 times. Complete this stretch 2 times per day.   STRENGTHENING EXERCISES - Plantar Fasciitis (Heel Spur Syndrome)  These exercises may help you when beginning to rehabilitate your injury. They may resolve your symptoms with or without further involvement from your physician, physical therapist or athletic trainer. While completing these exercises, remember:   Muscles can gain both the endurance and the strength needed for everyday activities through controlled exercises.  Complete these exercises as instructed by your physician, physical therapist or athletic trainer. Progress the resistance and repetitions only as guided.  STRENGTH - Towel Curls  Sit in a chair positioned on a non-carpeted surface.  Place your foot on a towel, keeping your heel  on the floor.  Pull the towel toward your heel by only curling your toes. Keep your heel on the floor. Repeat 3 times. Complete this exercise 2 times per day.  STRENGTH - Ankle Inversion  Secure one end of a rubber exercise band/tubing to a fixed object (table, pole). Loop the other end around your foot just before your toes.  Place your fists between your knees. This will focus your strengthening at your ankle.  Slowly, pull your big toe up and in, making sure the band/tubing is positioned to resist the entire motion.  Hold this position for 10 seconds.  Have your muscles resist the band/tubing as it slowly pulls your foot back to the starting position. Repeat 3 times. Complete this exercises 2 times per day.  Document Released: 07/26/2005 Document Revised: 10/18/2011 Document Reviewed: 11/07/2008 [facility] Patient Information 2014 [place], [place].  Ibuprofen tablets and capsules What is this medicine? IBUPROFEN (eye BYOO proe fen) is a non-steroidal anti-inflammatory drug (NSAID). It is used for dental pain, fever, headaches or migraines, osteoarthritis, rheumatoid arthritis, or painful monthly periods. It can also relieve minor aches and pains caused by a cold, flu, or sore throat. This medicine may be used for other purposes; ask your health care provider or pharmacist if you have questions. COMMON BRAND NAME(S): Advil, Advil Junior Strength, Advil Migraine, Genpril, Ibren, IBU, Midol, Midol Cramps and Body Aches, Motrin, Motrin IB, Motrin Junior Strength, Motrin Migraine Pain, Samson-8, Toxicology Saliva Collection What should I tell my health care provider before I take this medicine? They need to know if you have any of these conditions: -cigarette smoker -coronary artery bypass graft (CABG) surgery within the past 2 weeks -drink more than 3 alcohol-containing drinks a day -heart disease -high blood pressure -history of stomach bleeding -kidney disease -liver disease -lung or  breathing disease, like asthma -an unusual or allergic reaction to ibuprofen, aspirin, other NSAIDs, other medicines, foods, dyes, or preservatives -pregnant or trying to get pregnant -breast-feeding How should I use this medicine? Take this medicine by mouth with a glass of water. Follow the directions on the prescription label. Take this medicine with food if your stomach gets upset. Try to not lie down for at least 10 minutes after you take the medicine. Take your medicine at regular intervals. Do not take your medicine more often than directed. A special MedGuide will be given to you by the pharmacist with each prescription and refill. Be sure to read this information carefully each time. Talk to your pediatrician regarding the use of this medicine in children. Special care may be needed. Overdosage: If you think you have taken too much of this medicine contact a poison control center or emergency room at once. NOTE: This medicine is only for you. Do not share this  medicine with others. What if I miss a dose? If you miss a dose, take it as soon as you can. If it is almost time for your next dose, take only that dose. Do not take double or extra doses. What may interact with this medicine? Do not take this medicine with any of the following medications: -cidofovir -ketorolac -methotrexate -pemetrexed This medicine may also interact with the following medications: -alcohol -aspirin -diuretics -lithium -other drugs for inflammation like prednisone -warfarin This list may not describe all possible interactions. Give your health care provider a list of all the medicines, herbs, non-prescription drugs, or dietary supplements you use. Also tell them if you smoke, drink alcohol, or use illegal drugs. Some items may interact with your medicine. What should I watch for while using this medicine? Tell your doctor or healthcare professional if your symptoms do not start to get better or if they  get worse. This medicine does not prevent heart attack or stroke. In fact, this medicine may increase the chance of a heart attack or stroke. The chance may increase with longer use of this medicine and in people who have heart disease. If you take aspirin to prevent heart attack or stroke, talk with your doctor or health care professional. Do not take other medicines that contain aspirin, ibuprofen, or naproxen with this medicine. Side effects such as stomach upset, nausea, or ulcers may be more likely to occur. Many medicines available without a prescription should not be taken with this medicine. This medicine can cause ulcers and bleeding in the stomach and intestines at any time during treatment. Ulcers and bleeding can happen without warning symptoms and can cause death. To reduce your risk, do not smoke cigarettes or drink alcohol while you are taking this medicine. You may get drowsy or dizzy. Do not drive, use machinery, or do anything that needs mental alertness until you know how this medicine affects you. Do not stand or sit up quickly, especially if you are an older patient. This reduces the risk of dizzy or fainting spells. This medicine can cause you to bleed more easily. Try to avoid damage to your teeth and gums when you brush or floss your teeth. This medicine may be used to treat migraines. If you take migraine medicines for 10 or more days a month, your migraines may get worse. Keep a diary of headache days and medicine use. Contact your healthcare professional if your migraine attacks occur more frequently. What side effects may I notice from receiving this medicine? Side effects that you should report to your doctor or health care professional as soon as possible: -allergic reactions like skin rash, itching or hives, swelling of the face, lips, or tongue -severe stomach pain -signs and symptoms of bleeding such as bloody or black, tarry stools; red or dark-brown urine; spitting up  blood or brown material that looks like coffee grounds; red spots on the skin; unusual bruising or bleeding from the eye, gums, or nose -signs and symptoms of a blood clot such as changes in vision; chest pain; severe, sudden headache; trouble speaking; sudden numbness or weakness of the face, arm, or leg -unexplained weight gain or swelling -unusually weak or tired -yellowing of eyes or skin Side effects that usually do not require medical attention (report to your doctor or health care professional if they continue or are bothersome): -bruising -diarrhea -dizziness, drowsiness -headache -nausea, vomiting This list may not describe all possible side effects. Call your doctor for medical advice about  side effects. You may report side effects to FDA at 1-800-FDA-1088. Where should I keep my medicine? Keep out of the reach of children. Store at room temperature between 15 and 30 degrees C (59 and 86 degrees F). Keep container tightly closed. Throw away any unused medicine after the expiration date. NOTE: This sheet is a summary. It may not cover all possible information. If you have questions about this medicine, talk to your doctor, pharmacist, or health care provider.  2019 Elsevier/Gold Standard (2017-03-30 12:43:57)

## 2019-01-02 NOTE — Progress Notes (Signed)
Subjective: 43 year old female presents the office today for follow-up evaluation of bilateral heel pain, plantar fasciitis.  She states that the last injection was not as helpful.  She states that she still running she has no pain with actually running but is afterwards.  She said the next one is been helpful with the pain that she still experiencing the morning.  She has been doing stretching.  The steroids were somewhat helpful and may have helped a little.  Denies any systemic complaints such as fevers, chills, nausea, vomiting. No acute changes since last appointment, and no other complaints at this time.   Objective: AAO x3, NAD DP/PT pulses palpable bilaterally, CRT less than 3 seconds There is continuation of tenderness palpation of the plantar medial surgical of the calcaneus at the insertion of plantar fascia on the left side. Right side is doing well.  Minimal discomfort on his plantar central aspect of the left.  No pain to the arch of the foot.  No pain lateral compression.  Mild discomfort in the Achilles tendon.  There is no edema, erythema.  Negative Tinel sign. No open lesions or pre-ulcerative lesions.  No pain with calf compression, swelling, warmth, erythema  Assessment: Plantar fasciitis left >> right  Plan: -All treatment options discussed with the patient including all alternatives, risks, complications.  At this time will hold off another steroid injection.  The cam boot was dispensed today.  We will start physical therapy.  Will hold off on running keep low impact exercise.  As she is feeling better she can start transitioning back into her normal running. Discussed EPAT as well if symptoms continue.  -Ibuprofen prescribed to use prn  -Patient encouraged to call the office with any questions, concerns, change in symptoms.

## 2019-10-14 ENCOUNTER — Ambulatory Visit: Payer: BC Managed Care – PPO | Attending: Internal Medicine

## 2019-10-14 DIAGNOSIS — Z23 Encounter for immunization: Secondary | ICD-10-CM | POA: Insufficient documentation

## 2019-10-14 NOTE — Progress Notes (Signed)
   Covid-19 Vaccination Clinic  Name:  Sandra Vargas    MRN: [id number] DOB: 1975/08/13  10/14/2019  [name] was observed post Covid-19 immunization for 15 minutes without incident. She was provided with Vaccine Information Sheet and instruction to access the V-Safe system.   [name] was instructed to call 911 with any severe reactions post vaccine: Marland Kitchen Difficulty breathing  . Swelling of face and throat  . A fast heartbeat  . A bad rash all over body  . Dizziness and weakness   Immunizations Administered    Name Date Dose VIS Date Route   Pfizer COVID-19 Vaccine 10/14/2019 10:36 AM 0.3 mL 07/20/2019 Intramuscular   Manufacturer: [place]   Lot: [id number]   [place]: [id number]

## 2019-11-04 ENCOUNTER — Ambulatory Visit: Payer: BC Managed Care – PPO | Attending: Internal Medicine

## 2019-11-04 DIAGNOSIS — Z23 Encounter for immunization: Secondary | ICD-10-CM

## 2019-11-04 NOTE — Progress Notes (Signed)
   Covid-19 Vaccination Clinic  Name:  Sandra Vargas    MRN: [id number] DOB: 10-13-1975  11/04/2019  [name] was observed post Covid-19 immunization for 15 minutes without incident. She was provided with Vaccine Information Sheet and instruction to access the V-Safe system.   [name] was instructed to call 911 with any severe reactions post vaccine: Marland Kitchen Difficulty breathing  . Swelling of face and throat  . A fast heartbeat  . A bad rash all over body  . Dizziness and weakness   Immunizations Administered    Name Date Dose VIS Date Route   Pfizer COVID-19 Vaccine 11/04/2019  9:55 AM 0.3 mL 07/20/2019 Intramuscular   Manufacturer: [place]   Lot: [id number]   [place]: [id number]

## 2019-12-10 ENCOUNTER — Other Ambulatory Visit: Payer: Self-pay

## 2019-12-10 ENCOUNTER — Ambulatory Visit: Payer: BC Managed Care – PPO | Admitting: Podiatry

## 2019-12-10 ENCOUNTER — Encounter: Payer: Self-pay | Admitting: Podiatry

## 2019-12-10 DIAGNOSIS — B070 Plantar wart: Secondary | ICD-10-CM | POA: Diagnosis not present

## 2019-12-10 DIAGNOSIS — L989 Disorder of the skin and subcutaneous tissue, unspecified: Secondary | ICD-10-CM

## 2019-12-10 NOTE — Patient Instructions (Signed)
Keep the bandage on for 24 hours. At that time, remove and clean with soap and water. If it hurts or burns before 24 hours go ahead and remove the bandage and wash with soap and water. Keep the area clean. If there is any blistering cover with antibiotic ointment and a bandage. Monitor for any redness, drainage, or other signs of infection. Call the office if any are to occur. If you have any questions, please call the office at [phone].  

## 2019-12-12 NOTE — Progress Notes (Signed)
Subjective: 44 year old female presents the office today for concerns of a painful skin lesion and possible wart on her left heel as well as the right hallux.  Area is tender with pressure.  No redness or drainage or any swelling.  They have been getting larger.  No recent treatment in her shoes.  No other concerns today. Denies any systemic complaints such as fevers, chills, nausea, vomiting. No acute changes since last appointment, and no other complaints at this time.   Objective: AAO x3, NAD DP/PT pulses palpable bilaterally, CRT less than 3 seconds Hyperkeratotic lesion with evidence of verruca at the left heel as well as right plantar hallux.  No known ulceration.  Small amount of bleeding occurred to the left heel.  There is no surrounding erythema, ascending cellulitis.  No signs of infection noted today. No open lesions or pre-ulcerative lesions.  No pain with calf compression, swelling, warmth, erythema  Assessment: Bilateral verruca  Plan: -All treatment options discussed with the patient including all alternatives, risks, complications.  -Sharply debrided the lesions of any complications.  Bleeding did occur on the left side.  Cleaned with alcohol.  Antibiotic ointment and a bandage applied.  Continue similar dressing.  On the right side areas cleaned alcohol and a pad was placed followed by salicylic acid and a bandage.  Post procedure instruction discussed.  Monitor for any signs or symptoms of infection. -Patient encouraged to call the office with any questions, concerns, change in symptoms.   Trula Slade DPM

## 2020-01-29 IMAGING — MG DIGITAL DIAGNOSTIC BILATERAL MAMMOGRAM WITH TOMO AND CAD
6 of 10 series · 6 of 30 positions shown · non-contrast
Comparison: Previous exam(s).

ADDENDUM:
Surgical consultation has been arranged with Dr. Jaylon Aujla at
[REDACTED] on January 03, 2018 for clear spontaneous RIGHT
nipple discharge for one year. The patient is aware and agrees to
the date and time of the appointment.

Reported by Danii Aujla, RN
CLINICAL DATA: 42-year-old patient has an approximately 1 year
history of unilateral clear spontaneous right nipple discharge. She
finds the discharge on her clothes on most every day. She does not
express the discharge manually. She denies any red bloody or brown
nipple discharge. She denies any pain. She does not palpate a lump.
She was evaluated in Monday November, 2016 for the same concern. Since that
time, she has not had any further evaluation with breast MRI or
surgical consultation.
EXAM:
DIGITAL DIAGNOSTIC BILATERAL MAMMOGRAM WITH CAD AND TOMO
ULTRASOUND RIGHT BREAST

[L MLO synth-2D]
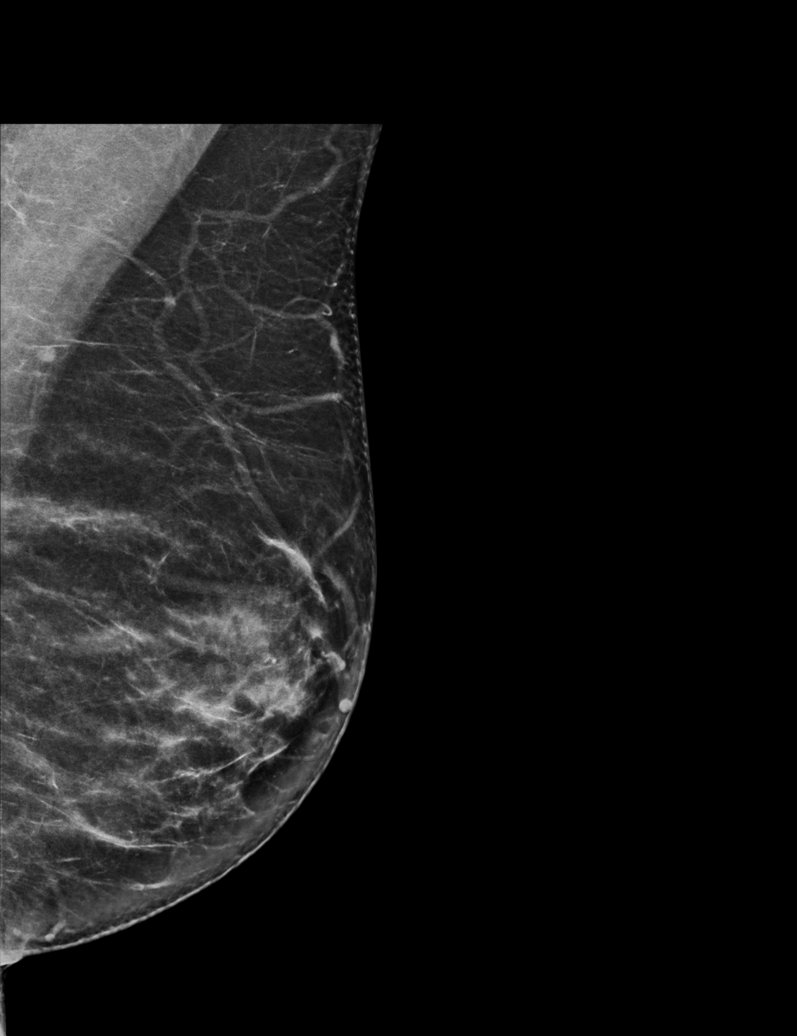

[R MLO synth-2D]
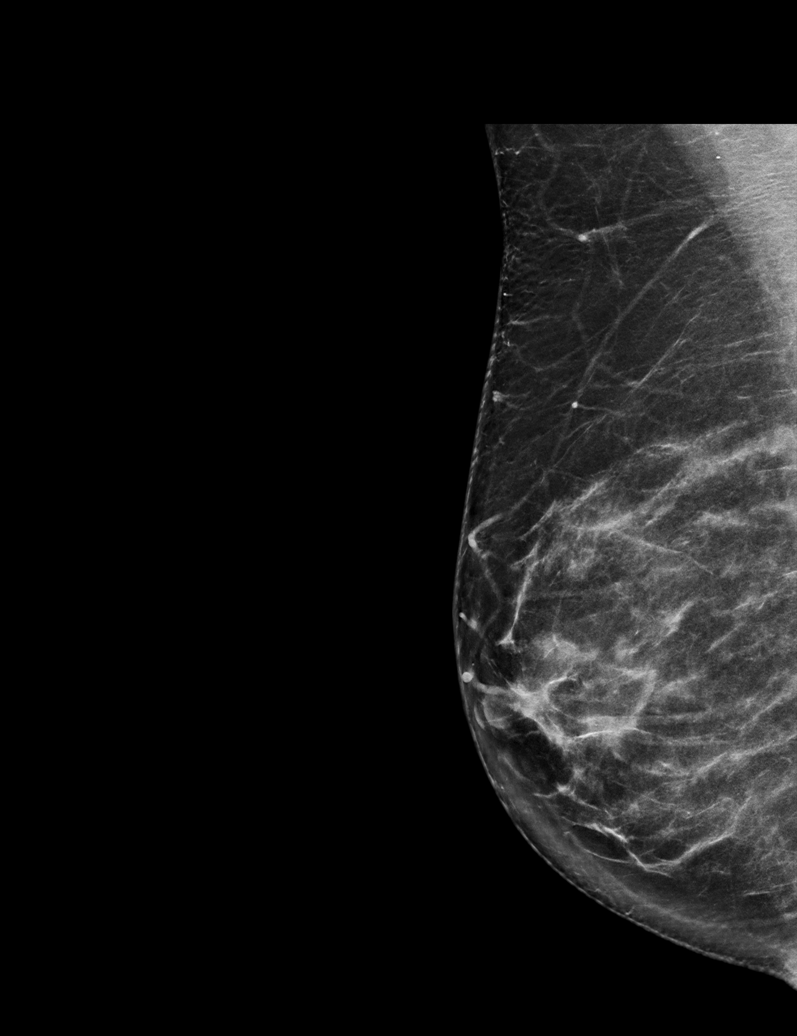

[R CC synth-2D]
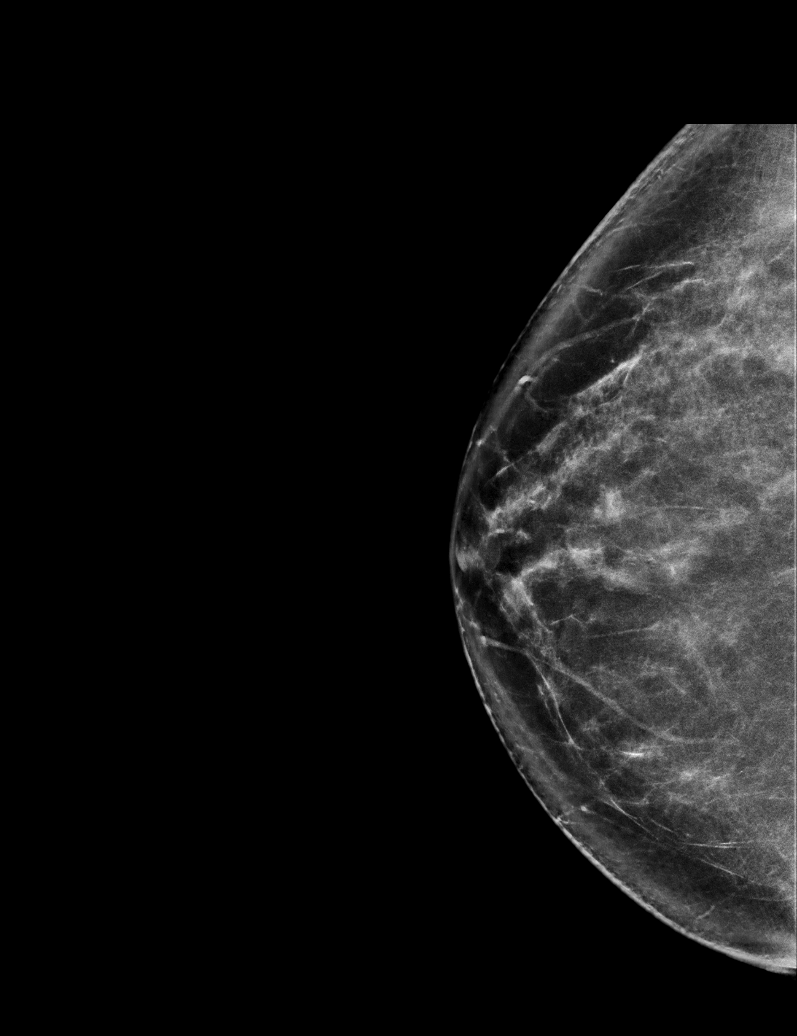

[R TAN synth-2D]
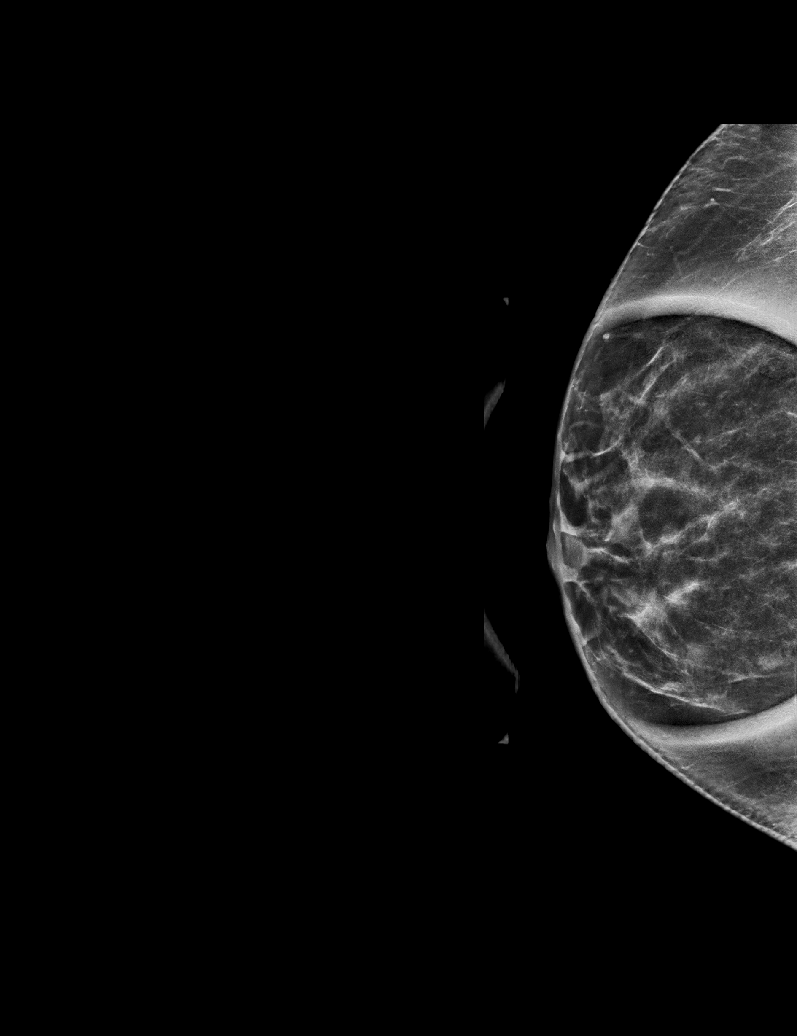

[L CC synth-2D]
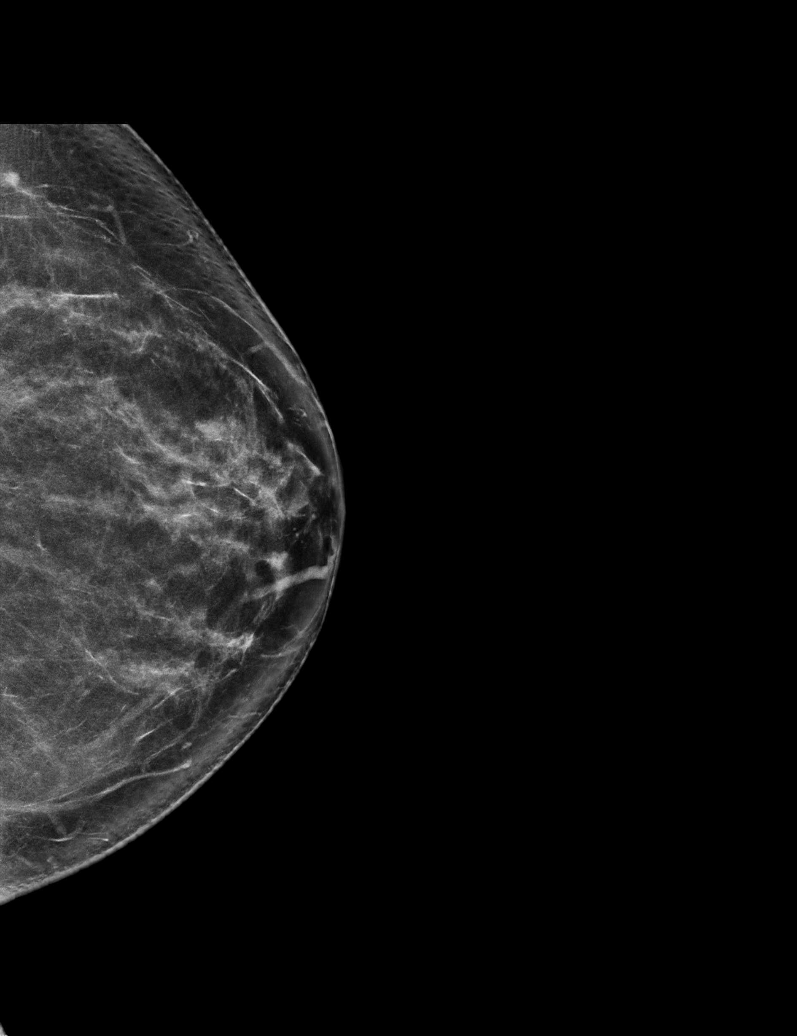

[R TAN tomo · tomo slice 34/67.0]
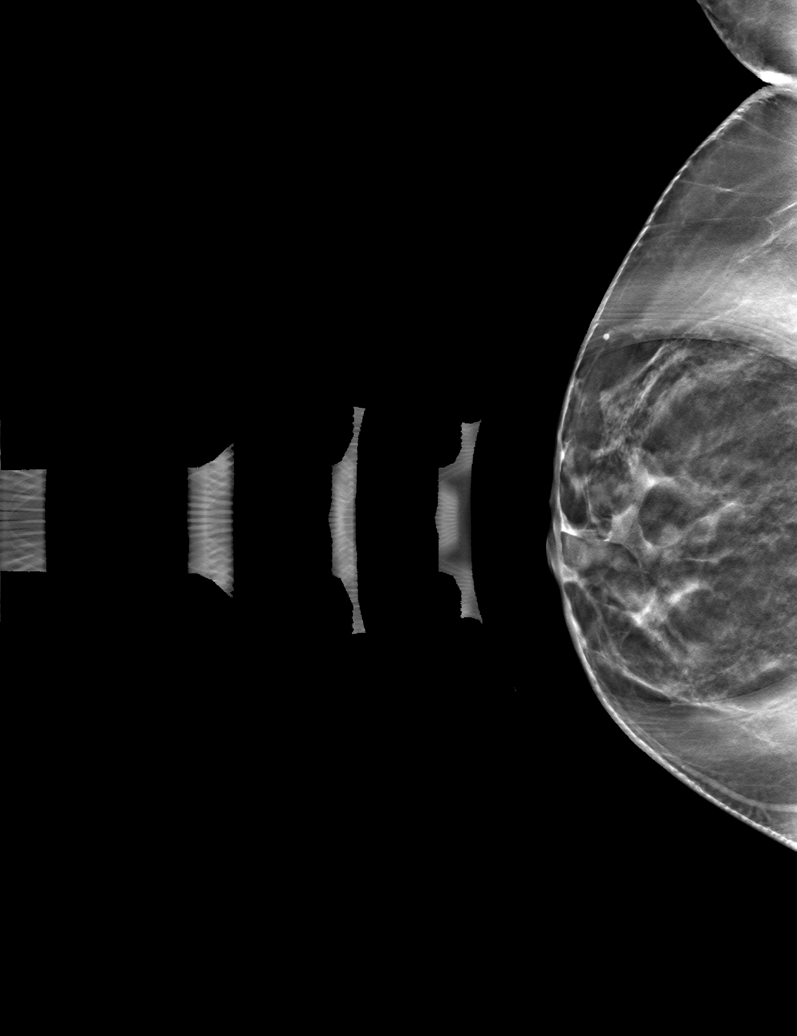

[6 of 30 positions shown; findings below may reference images not displayed]

ACR Breast Density Category b: There are scattered areas of
fibroglandular density.
FINDINGS: Parenchymal pattern of both breasts appears stable. No suspicious
mass, architectural distortion, or suspicious microcalcification is
identified in either breast. There continue to be circumscribed
nodular densities in the retroareolar right breast, previously shown
to be a cluster of cysts.

Mammographic images were processed with CAD.

On physical exam, I am unable to elicit nipple discharge on the
right with manual compression today.

Targeted ultrasound is performed, showing a single dilated duct in
the immediate retroareolar right breast 6 o'clock region, which
extends in to the right nipple. This dilated duct is blind ending,
with no definite intraductal mass identified. No additional dilated
ducts are identified. No suspicious parenchymal mass. More
peripherally in the periareolar 6 o'clock right breast is a cluster
of benign appearing cysts, similar to the prior ultrasound of 5985.
IMPRESSION: Single a dilated duct within the right nipple and extending into the
immediate retroareolar right breast in the 6 o'clock position. Given
the patient's history of persistent, nearly daily, nipple discharge
on the right, findings are suspicious for a sonographically occult
intraductal papilloma. Malignancy cannot be completely excluded.

RECOMMENDATION:
Surgical consultation for the patient's chronic unilateral right
nipple discharge and isolated dilated duct as described above is
recommended. Our office will contact [REDACTED] on
[REDACTED] and contact the patient with an appointment.

I have discussed the findings and recommendations with the patient.
Results were also provided in writing at the conclusion of the
visit. If applicable, a reminder letter will be sent to the patient
regarding the next appointment.

BI-RADS CATEGORY  4: Suspicious.

## 2020-02-07 DIAGNOSIS — U071 COVID-19: Secondary | ICD-10-CM

## 2020-02-07 HISTORY — DX: COVID-19: U07.1

## 2020-07-28 ENCOUNTER — Other Ambulatory Visit: Payer: BC Managed Care – PPO

## 2020-10-27 ENCOUNTER — Encounter (HOSPITAL_BASED_OUTPATIENT_CLINIC_OR_DEPARTMENT_OTHER): Payer: Self-pay | Admitting: Obstetrics and Gynecology

## 2020-10-27 ENCOUNTER — Other Ambulatory Visit: Payer: Self-pay

## 2020-10-27 NOTE — Progress Notes (Signed)
Spoke w/ via phone for pre-op interview---pt Lab needs dos---- urine poct              Lab results------none COVID test ------10-28-2020 1445 Arrive at -------1045 am 10-30-2020 NPO after MN NO Solid Food.  Clear liquids from MN until---945 am then npo Med rec completed Medications to take morning of surgery -----none Diabetic medication -----n./a Patient instructed to bring photo id and insurance card day of surgery Patient aware to have Driver (ride ) / caregiver spouse tommy will stay    for 24 hours after surgery  Patient Special Instructions -----none Pre-Op special Istructions -----none Patient verbalized understanding of instructions that were given at this phone interview. Patient denies shortness of breath, chest pain, fever, cough at this phone interview.

## 2020-10-28 ENCOUNTER — Other Ambulatory Visit (HOSPITAL_COMMUNITY)
Admission: RE | Admit: 2020-10-28 | Discharge: 2020-10-28 | Disposition: A | Discharge status: Home / Self Care | Payer: BC Managed Care – PPO | Source: Ambulatory Visit | Attending: Obstetrics and Gynecology | Admitting: Obstetrics and Gynecology

## 2020-10-28 DIAGNOSIS — Z85820 Personal history of malignant melanoma of skin: Secondary | ICD-10-CM | POA: Diagnosis not present

## 2020-10-28 DIAGNOSIS — N921 Excessive and frequent menstruation with irregular cycle: Secondary | ICD-10-CM | POA: Diagnosis present

## 2020-10-28 DIAGNOSIS — Z01812 Encounter for preprocedural laboratory examination: Secondary | ICD-10-CM | POA: Insufficient documentation

## 2020-10-28 DIAGNOSIS — Z8616 Personal history of COVID-19: Secondary | ICD-10-CM | POA: Diagnosis not present

## 2020-10-28 DIAGNOSIS — Z20822 Contact with and (suspected) exposure to covid-19: Secondary | ICD-10-CM | POA: Insufficient documentation

## 2020-10-28 DIAGNOSIS — Z79899 Other long term (current) drug therapy: Secondary | ICD-10-CM | POA: Diagnosis not present

## 2020-10-28 LAB — SARS CORONAVIRUS 2 (TAT 6-24 HRS): SARS Coronavirus 2: NEGATIVE

## 2020-10-29 ENCOUNTER — Other Ambulatory Visit: Payer: Self-pay | Admitting: Obstetrics and Gynecology

## 2020-10-30 ENCOUNTER — Encounter (HOSPITAL_BASED_OUTPATIENT_CLINIC_OR_DEPARTMENT_OTHER): Payer: Self-pay | Admitting: Obstetrics and Gynecology

## 2020-10-30 ENCOUNTER — Encounter (HOSPITAL_BASED_OUTPATIENT_CLINIC_OR_DEPARTMENT_OTHER)
Admission: RE | Disposition: A | Discharge status: Home / Self Care | Payer: Self-pay | Source: Ambulatory Visit | Attending: Obstetrics and Gynecology

## 2020-10-30 ENCOUNTER — Ambulatory Visit (HOSPITAL_BASED_OUTPATIENT_CLINIC_OR_DEPARTMENT_OTHER)
Admission: RE | Admit: 2020-10-30 | Discharge: 2020-10-30 | Disposition: A | Discharge status: Home / Self Care | Payer: BC Managed Care – PPO | Source: Ambulatory Visit | Attending: Obstetrics and Gynecology | Admitting: Obstetrics and Gynecology

## 2020-10-30 ENCOUNTER — Other Ambulatory Visit: Payer: Self-pay

## 2020-10-30 ENCOUNTER — Ambulatory Visit (HOSPITAL_BASED_OUTPATIENT_CLINIC_OR_DEPARTMENT_OTHER): Payer: BC Managed Care – PPO | Admitting: Certified Registered Nurse Anesthetist

## 2020-10-30 DIAGNOSIS — Z20822 Contact with and (suspected) exposure to covid-19: Secondary | ICD-10-CM | POA: Insufficient documentation

## 2020-10-30 DIAGNOSIS — N921 Excessive and frequent menstruation with irregular cycle: Secondary | ICD-10-CM | POA: Insufficient documentation

## 2020-10-30 DIAGNOSIS — Z79899 Other long term (current) drug therapy: Secondary | ICD-10-CM | POA: Insufficient documentation

## 2020-10-30 DIAGNOSIS — Z85820 Personal history of malignant melanoma of skin: Secondary | ICD-10-CM | POA: Insufficient documentation

## 2020-10-30 DIAGNOSIS — Z8616 Personal history of COVID-19: Secondary | ICD-10-CM | POA: Insufficient documentation

## 2020-10-30 HISTORY — PX: IUD REMOVAL: SHX5392

## 2020-10-30 HISTORY — PX: DILITATION & CURRETTAGE/HYSTROSCOPY WITH NOVASURE ABLATION: SHX5568

## 2020-10-30 LAB — POCT PREGNANCY, URINE: Preg Test, Ur: NEGATIVE

## 2020-10-30 SURGERY — DILATATION & CURETTAGE/HYSTEROSCOPY WITH NOVASURE ABLATION
Anesthesia: General | Site: Vagina

## 2020-10-30 MED ORDER — MIDAZOLAM HCL 2 MG/2ML IJ SOLN
INTRAMUSCULAR | Status: DC | PRN
  Administered 2020-10-30: 2 mg via INTRAVENOUS

## 2020-10-30 MED ORDER — SODIUM CHLORIDE 0.9 % IR SOLN
Status: DC | PRN
  Administered 2020-10-30: 3000 mL

## 2020-10-30 MED ORDER — ACETAMINOPHEN 500 MG PO TABS
1000.0000 mg | ORAL_TABLET | Freq: Once | ORAL | Status: AC
  Administered 2020-10-30: 1000 mg via ORAL

## 2020-10-30 MED ORDER — SCOPOLAMINE 1 MG/3DAYS TD PT72
MEDICATED_PATCH | TRANSDERMAL | Status: AC
  Filled 2020-10-30: qty 1

## 2020-10-30 MED ORDER — FENTANYL CITRATE (PF) 100 MCG/2ML IJ SOLN
25.0000 ug | INTRAMUSCULAR | Status: DC | PRN
Start: 2020-10-30 — End: 2020-10-30

## 2020-10-30 MED ORDER — ACETAMINOPHEN 500 MG PO TABS
ORAL_TABLET | ORAL | Status: AC
  Filled 2020-10-30: qty 2

## 2020-10-30 MED ORDER — LIDOCAINE 2% (20 MG/ML) 5 ML SYRINGE
INTRAMUSCULAR | Status: DC | PRN
  Administered 2020-10-30: 100 mg via INTRAVENOUS

## 2020-10-30 MED ORDER — LIDOCAINE HCL 1 % IJ SOLN
INTRAMUSCULAR | Status: DC | PRN
  Administered 2020-10-30: 10 mL

## 2020-10-30 MED ORDER — ONDANSETRON HCL 4 MG/2ML IJ SOLN
INTRAMUSCULAR | Status: AC
  Filled 2020-10-30: qty 2

## 2020-10-30 MED ORDER — PROPOFOL 10 MG/ML IV BOLUS
INTRAVENOUS | Status: DC | PRN
  Administered 2020-10-30: 160 mg via INTRAVENOUS

## 2020-10-30 MED ORDER — MIDAZOLAM HCL 2 MG/2ML IJ SOLN
INTRAMUSCULAR | Status: AC
  Filled 2020-10-30: qty 2

## 2020-10-30 MED ORDER — PROPOFOL 10 MG/ML IV BOLUS
INTRAVENOUS | Status: AC
  Filled 2020-10-30: qty 20

## 2020-10-30 MED ORDER — HYDROCODONE-ACETAMINOPHEN 5-325 MG PO TABS: ORAL_TABLET | ORAL | 0 refills | Status: DC

## 2020-10-30 MED ORDER — ONDANSETRON HCL 4 MG/2ML IJ SOLN
INTRAMUSCULAR | Status: DC | PRN
  Administered 2020-10-30: 4 mg via INTRAVENOUS

## 2020-10-30 MED ORDER — FENTANYL CITRATE (PF) 100 MCG/2ML IJ SOLN
INTRAMUSCULAR | Status: AC
  Filled 2020-10-30: qty 2

## 2020-10-30 MED ORDER — DEXAMETHASONE SODIUM PHOSPHATE 10 MG/ML IJ SOLN
INTRAMUSCULAR | Status: DC | PRN
  Administered 2020-10-30: 5 mg via INTRAVENOUS

## 2020-10-30 MED ORDER — FENTANYL CITRATE (PF) 100 MCG/2ML IJ SOLN
INTRAMUSCULAR | Status: DC | PRN
  Administered 2020-10-30 (×2): 50 ug via INTRAVENOUS

## 2020-10-30 MED ORDER — KETOROLAC TROMETHAMINE 30 MG/ML IJ SOLN
INTRAMUSCULAR | Status: AC
  Filled 2020-10-30: qty 1

## 2020-10-30 MED ORDER — PROMETHAZINE HCL 25 MG/ML IJ SOLN: 6.2500 mg | INTRAMUSCULAR | Status: DC | PRN

## 2020-10-30 MED ORDER — OXYCODONE HCL 5 MG/5ML PO SOLN: 5.0000 mg | Freq: Once | ORAL | Status: DC | PRN

## 2020-10-30 MED ORDER — POVIDONE-IODINE 10 % EX SWAB: 2.0000 "application " | Freq: Once | CUTANEOUS | Status: DC

## 2020-10-30 MED ORDER — SCOPOLAMINE 1 MG/3DAYS TD PT72
1.0000 | MEDICATED_PATCH | Freq: Once | TRANSDERMAL | Status: AC
  Administered 2020-10-30: 1 via TRANSDERMAL

## 2020-10-30 MED ORDER — LACTATED RINGERS IV SOLN: INTRAVENOUS | Status: DC

## 2020-10-30 MED ORDER — KETOROLAC TROMETHAMINE 30 MG/ML IJ SOLN
INTRAMUSCULAR | Status: DC | PRN
  Administered 2020-10-30: 30 mg via INTRAVENOUS

## 2020-10-30 MED ORDER — WHITE PETROLATUM EX OINT
TOPICAL_OINTMENT | CUTANEOUS | Status: AC
  Filled 2020-10-30: qty 5

## 2020-10-30 MED ORDER — LACTATED RINGERS IV SOLN
INTRAVENOUS | Status: DC
  Administered 2020-10-30: 1000 mL via INTRAVENOUS

## 2020-10-30 MED ORDER — SOD CITRATE-CITRIC ACID 500-334 MG/5ML PO SOLN: 30.0000 mL | ORAL | Status: DC

## 2020-10-30 MED ORDER — LIDOCAINE 2% (20 MG/ML) 5 ML SYRINGE
INTRAMUSCULAR | Status: AC
  Filled 2020-10-30: qty 5

## 2020-10-30 MED ORDER — OXYCODONE HCL 5 MG PO TABS
5.0000 mg | ORAL_TABLET | Freq: Once | ORAL | Status: DC | PRN
Start: 2020-10-30 — End: 2020-10-30

## 2020-10-30 MED ORDER — IBUPROFEN 600 MG PO TABS: 600.0000 mg | ORAL_TABLET | Freq: Four times a day (QID) | ORAL | 0 refills | Status: AC | PRN

## 2020-10-30 MED ORDER — DEXAMETHASONE SODIUM PHOSPHATE 10 MG/ML IJ SOLN
INTRAMUSCULAR | Status: AC
  Filled 2020-10-30: qty 1

## 2020-10-30 SURGICAL SUPPLY — 16 items
CATH ROBINSON RED A/P 16FR (CATHETERS) ×3 IMPLANT
COVER WAND RF STERILE (DRAPES) ×3 IMPLANT
GAUZE 4X4 16PLY RFD (DISPOSABLE) ×3 IMPLANT
GLOVE SURG ENC MOIS LTX SZ7 (GLOVE) ×3 IMPLANT
GLOVE SURG UNDER POLY LF SZ7 (GLOVE) ×2 IMPLANT
GLOVE SURG UNDER POLY LF SZ7.5 (GLOVE) ×1 IMPLANT
GOWN STRL REUS W/TWL LRG LVL3 (GOWN DISPOSABLE) ×9 IMPLANT
IV NS 1000ML (IV SOLUTION) ×3
IV NS 1000ML BAXH (IV SOLUTION) IMPLANT
IV NS IRRIG 3000ML ARTHROMATIC (IV SOLUTION) ×1 IMPLANT
KIT PROCEDURE FLUENT (KITS) ×3 IMPLANT
KIT TURNOVER CYSTO (KITS) ×3 IMPLANT
PACK VAGINAL MINOR WOMEN LF (CUSTOM PROCEDURE TRAY) ×3 IMPLANT
PAD OB MATERNITY 4.3X12.25 (PERSONAL CARE ITEMS) ×3 IMPLANT
SEAL ROD LENS SCOPE MYOSURE (ABLATOR) ×3 IMPLANT
TOWEL OR 17X26 10 PK STRL BLUE (TOWEL DISPOSABLE) ×5 IMPLANT

## 2020-10-30 NOTE — Anesthesia Postprocedure Evaluation (Signed)
Anesthesia Post Note  Patient: Sandra Vargas  Procedure(s) Performed: DILATATION & CURETTAGE/HYSTEROSCOPY (N/A Vagina ) INTRAUTERINE DEVICE (IUD) REMOVAL (Vagina )     Patient location during evaluation: PACU Anesthesia Type: General Level of consciousness: awake and alert and oriented Pain management: pain level controlled Vital Signs Assessment: post-procedure vital signs reviewed and stable Respiratory status: spontaneous breathing, nonlabored ventilation and respiratory function stable Cardiovascular status: blood pressure returned to baseline Postop Assessment: no apparent nausea or vomiting Anesthetic complications: no   No complications documented.  Last Vitals:  Vitals:   10/30/20 1330 10/30/20 1345  BP: 124/73 119/64  Pulse: 70 71  Resp: 15 16  Temp:    SpO2: 100% 100%    Last Pain:  Vitals:   10/30/20 1345  TempSrc:   PainSc: [place]

## 2020-10-30 NOTE — H&P (Signed)
Sandra Vargas is an 45 y.o. female.  45 yo Presents for surgical management of menometrorrhagia. Pt has been using an IUD for management of menorrhagia. In the last 6 months she has experienced increased irregular bleeding and cramping. A pelvic ultrasound was normal with the IUD within the endometrial cavity in the appropriate location. Management options discussed and she desires an endometrial ablation. She is using tubal ligation for contraception. R/B/A reviewed and she wishes to proceed    Patient's last menstrual period was 10/27/2020.    Past Medical History:  Diagnosis Date  . Anxiety   . Cancer ([place]) 2012   melanoma area removed fron left calf  . COVID 02/2020   sinus infection cough sob fever body aches h/a x 2 weeks all symptoms resolved  . Dental crown present   . Family history of adverse reaction to anesthesia    pt's mother has hx. of post-op N/V  . PONV (postoperative nausea and vomiting)    iv meds help with ponv    Past Surgical History:  Procedure Laterality Date  . APPENDECTOMY  2007   laparoscopic  . BREAST DUCTAL SYSTEM EXCISION Right 03/14/2018   Procedure: RIGHT BREAST CENTRAL DUCT EXCISION ERAS PATHWAY;  Surgeon: Erroll Luna, MD;  Location: [facility];  Service: General;  Laterality: Right;  . CHOLECYSTECTOMY  yrs ago   laparoscopic  . DILATION AND EVACUATION  08/14/2009  . TUBAL LIGATION  08/14/2009  . WRIST MASS EXCISION Right 11/18/2016    Family History  Problem Relation Age of Onset  . Breast cancer Maternal Aunt   . Anesthesia problems Mother        post-op N/V    Social History:  reports that she has never smoked. She has never used smokeless tobacco. She reports that she does not drink alcohol and does not use drugs.  Allergies:  Allergies  Allergen Reactions  . Codeine Nausea And Vomiting    Medications Prior to Admission  Medication Sig Dispense Refill Last Dose  . FLUoxetine (PROZAC) 20 MG capsule Take 20  mg by mouth at bedtime.    10/29/2020 at Unknown time  . levonorgestrel (MIRENA) 20 MCG/24HR IUD 1 each by Intrauterine route once. Had for last 6 years       Review of Systems  Blood pressure 136/85, pulse 62, temperature (!) 97 F (36.1 C), temperature source Oral, resp. rate 15, height 5\' 9"  (1.753 m), weight 88.6 kg, last menstrual period 10/27/2020, SpO2 100 %. Physical Exam   AOX3, NAD normal work of breathing abd soft  Results for orders placed or performed during the hospital encounter of 10/30/20 (from the past 24 hour(s))  Pregnancy, urine POC     Status: None   Collection Time: 10/30/20 11:10 AM  Result Value Ref Range   Preg Test, Ur NEGATIVE NEGATIVE    No results found.  Assessment/Plan: 1) Admit 2) Proceed with hysteroscopy, D&C, endometrial ablation 3) SCDs to [facility] 10/30/2020, 12:04 PM

## 2020-10-30 NOTE — Anesthesia Procedure Notes (Signed)
Procedure Name: LMA Insertion Date/Time: 10/30/2020 12:37 PM Performed by: Suan Halter, CRNA Pre-anesthesia Checklist: Patient identified, Emergency Drugs available, Suction available and Patient being monitored Patient Re-evaluated:Patient Re-evaluated prior to induction Oxygen Delivery Method: Circle system utilized Preoxygenation: Pre-oxygenation with 100% oxygen Induction Type: IV induction Ventilation: Mask ventilation without difficulty LMA: LMA inserted LMA Size: 4.0 Number of attempts: 1 Airway Equipment and Method: Bite block Placement Confirmation: positive ETCO2 Tube secured with: Tape Dental Injury: Teeth and Oropharynx as per pre-operative assessment

## 2020-10-30 NOTE — Op Note (Signed)
Pre-Operative Diagnosis: 1) Menometrorrhagia  Postoperative Diagnosis: 1) Menometrorrhagia Procedure: Hysteroscopgy, dilation and curettage, removal intrauterine device Surgeon: Dr. Vanessa Kick Assistant: none Operative Findings: Multiparous appearing cervix, uterus sounded to 12 cm. With visualization of the endometrial cavity to defects were noted in the fundal portion of the endometrium. Bilateral tubal ostia were visualized separate from these defects. It was noted that the fluid deficit was 700 cc after the initial hysteroscope. With second scope the deficit climbed to 900 cc and it was felt that at least one of the defects at the fundus represented a uterine perforation therefore the procedure was terminated. Final fluid deficit 900 cc. No attempt at ablation was performed. Complication: Fundal uterine performation Specimen: Endometrial curettings EBL: Minimal  Sandra Vargas Is a 45 year old female who presents for surgical management for menometrorrhagia. Please see the patient's history and physical for complete details of the history. Management options were discussed with the patient. R/B/A reviewed. Following appropriate informed consent was taken to the operating room. The patient was appropriately identified during a time out procedure. General anesthesia was administered and the patient was placed in the dorsal lithotomy position. The patient was prepped and draped in the normal sterile fashion. A speculum was placed into the vagina, a single-tooth tenaculum was placed on the anterior lip of the cervix, and 10 cc of 1% lidocaine was administered in a paracervical fashion.  The intrauterine device was removed.  The cervix was serially dilated with Hank dilators. The uterus sounded to 12 cm.  The hysteroscope was introduced for the above findings.  The hysteroscope was removed and a sharp curettage was performed.  The hysteroscope was again introduced.  The site of suspected perforation was  without significant bleeding.  Given probable fundal uterine perforation the procedure was discontinued.  No attempt at endometrial ablation was performed.  All sponge, lap, needle counts were correct.  The patient was extubated and transferred to the PACU in stable condition following the procedure

## 2020-10-30 NOTE — Transfer of Care (Signed)
Immediate Anesthesia Transfer of Care Note  Patient: Sandra Vargas  Procedure(s) Performed: Procedure(s) (LRB): DILATATION & CURETTAGE/HYSTEROSCOPY (N/A) INTRAUTERINE DEVICE (IUD) REMOVAL  Patient Location: PACU  Anesthesia Type: General  Level of Consciousness: awake, oriented, sedated and patient cooperative  Airway & Oxygen Therapy: Patient Spontanous Breathing and Patient connected to face mask oxygen  Post-op Assessment: Report given to PACU RN and Post -op Vital signs reviewed and stable  Post vital signs: Reviewed and stable  Complications: No apparent anesthesia complications Last Vitals:  Vitals Value Taken Time  BP 120/72 10/30/20 1316  Temp 36.4 C 10/30/20 1315  Pulse 76 10/30/20 1318  Resp 17 10/30/20 1318  SpO2 100 % 10/30/20 1318  Vitals shown include unvalidated device data.  Last Pain:  Vitals:   10/30/20 1136  TempSrc: Oral  PainSc: 0-No pain      Patients Stated Pain Goal: 4 (02/77/41 2878)  Complications: No complications documented.

## 2020-10-30 NOTE — Discharge Instructions (Signed)
DISCHARGE INSTRUCTIONS: D&C / D&E The following instructions have been prepared to help you care for yourself upon your return home.   Personal hygiene: Marland Kitchen Use sanitary pads for vaginal drainage, not tampons. . Shower the day after your procedure. . NO tub baths, pools or Jacuzzis for 2-3 weeks. . Wipe front to back after using the bathroom.  Activity and limitations: . Do NOT drive or operate any equipment for 24 hours. The effects of anesthesia are still present and drowsiness may result. . Do NOT rest in bed all day. . Walking is encouraged. . Walk up and down stairs slowly. . You may resume your normal activity in one to two days or as indicated by your physician.  Sexual activity: NO intercourse for at least 2 weeks after the procedure, or as indicated by your physician.  Diet: Eat a light meal as desired this evening. You may resume your usual diet tomorrow.  Return to work: You may resume your work activities in one to two days or as indicated by your doctor.  What to expect after your surgery: Expect to have vaginal bleeding/discharge for 2-3 days and spotting for up to 10 days. It is not unusual to have soreness for up to 1-2 weeks. You may have a slight burning sensation when you urinate for the first day. Mild cramps may continue for a couple of days. You may have a regular period in 2-6 weeks.  Call your doctor for any of the following: . Excessive vaginal bleeding, saturating and changing one pad every hour. . Inability to urinate 6 hours after discharge from hospital. . Pain not relieved by pain medication. . Fever of 100.4 F or greater. . Unusual vaginal discharge or odor.   Post Anesthesia Home Care Instructions  Activity: Get plenty of rest for the remainder of the day. A responsible individual must stay with you for 24 hours following the procedure.  For the next 24 hours, DO NOT: -Drive a car -Paediatric nurse -Drink alcoholic beverages -Take any medication  unless instructed by your physician -Make any legal decisions or sign important papers.  Meals: Start with liquid foods such as gelatin or soup. Progress to regular foods as tolerated. Avoid greasy, spicy, heavy foods. If nausea and/or vomiting occur, drink only clear liquids until the nausea and/or vomiting subsides. Call your physician if vomiting continues.  Special Instructions/Symptoms: Your throat may feel dry or sore from the anesthesia or the breathing tube placed in your throat during surgery. If this causes discomfort, gargle with warm salt water. The discomfort should disappear within 24 hours.  If you had a scopolamine patch placed behind your ear for the management of post- operative nausea and/or vomiting:  1. The medication in the patch is effective for 72 hours, after which it should be removed.  Wrap patch in a tissue and discard in the trash. Wash hands thoroughly with soap and water. 2. You may remove the patch earlier than 72 hours if you experience unpleasant side effects which may include dry mouth, dizziness or visual disturbances. 3. Avoid touching the patch. Wash your hands with soap and water after contact with the patch. Remove patch by Sunday, 3/27.     Next dose of tylenol after 6:15 PM today if needed.

## 2020-10-30 NOTE — Anesthesia Preprocedure Evaluation (Addendum)
Anesthesia Evaluation  Patient identified by MRN, date of birth, ID band Patient awake    Reviewed: Allergy & Precautions, NPO status , Patient's Chart, lab work & pertinent test results  History of Anesthesia Complications (+) PONV and history of anesthetic complications  Airway Mallampati: II  TM Distance: >3 FB Neck ROM: Full    Dental no notable dental hx.    Pulmonary neg pulmonary ROS,    Pulmonary exam normal        Cardiovascular negative cardio ROS Normal cardiovascular exam     Neuro/Psych negative neurological ROS  negative psych ROS   GI/Hepatic negative GI ROS, Neg liver ROS,   Endo/Other  negative endocrine ROS  Renal/GU negative Renal ROS  negative genitourinary   Musculoskeletal negative musculoskeletal ROS (+)   Abdominal   Peds  Hematology negative hematology ROS (+)   Anesthesia Other Findings Day of surgery medications reviewed with patient.  Reproductive/Obstetrics menorrhagia                            Anesthesia Physical Anesthesia Plan  ASA: II  Anesthesia Plan: General   Post-op Pain Management:    Induction: Intravenous  PONV Risk Score and Plan: 4 or greater and Treatment may vary due to age or medical condition, Ondansetron, Dexamethasone, Midazolam and Scopolamine patch - Pre-op  Airway Management Planned: LMA  Additional Equipment: None  Intra-op Plan:   Post-operative Plan: Extubation in OR  Informed Consent: I have reviewed the patients History and Physical, chart, labs and discussed the procedure including the risks, benefits and alternatives for the proposed anesthesia with the patient or authorized representative who has indicated his/her understanding and acceptance.     Dental advisory given  Plan Discussed with: CRNA  Anesthesia Plan Comments:        Anesthesia Quick Evaluation

## 2020-10-31 ENCOUNTER — Encounter (HOSPITAL_BASED_OUTPATIENT_CLINIC_OR_DEPARTMENT_OTHER): Payer: Self-pay | Admitting: Obstetrics and Gynecology

## 2020-10-31 LAB — SURGICAL PATHOLOGY

## 2021-02-04 ENCOUNTER — Encounter (HOSPITAL_BASED_OUTPATIENT_CLINIC_OR_DEPARTMENT_OTHER): Payer: Self-pay | Admitting: Obstetrics and Gynecology

## 2021-02-04 DIAGNOSIS — N920 Excessive and frequent menstruation with regular cycle: Secondary | ICD-10-CM

## 2021-02-04 HISTORY — DX: Excessive and frequent menstruation with regular cycle: N92.0

## 2021-02-05 ENCOUNTER — Encounter (HOSPITAL_BASED_OUTPATIENT_CLINIC_OR_DEPARTMENT_OTHER): Payer: Self-pay | Admitting: Obstetrics and Gynecology

## 2021-02-05 ENCOUNTER — Other Ambulatory Visit: Payer: Self-pay

## 2021-02-05 NOTE — Progress Notes (Signed)
Spoke w/ via phone for pre-op interview---pt Lab needs dos----  urine preg             Lab results------la appt 02-06-2021 1000 am for cbc t & s COVID test -----02-06-2021 1115 (extended stay) Arrive at -------1030 am 02-10-2021 NPO after MN NO Solid Food.  Clear liquids from MN until---930 am then npo Med rec completed Medications to take morning of surgery -----none Diabetic medication -----n/a Patient instructed no nail polish to be worn day of surgery Patient instructed to bring photo id and insurance card day of surgery Patient aware to have Driver (ride ) / caregiver   husband tommy  for 24 hours after surgery  Patient Special Instructions -----none Pre-Op special Istructions -----none Patient verbalized understanding of instructions that were given at this phone interview. Patient denies shortness of breath, chest pain, fever, cough at this phone interview.

## 2021-02-05 NOTE — Progress Notes (Signed)
YOU ARE SCHEDULED FOR A COVID TEST ON   02-06-2021 at 51 AM.  THIS TEST MUST BE DONE BEFORE SURGERY. GO TO  [place]. [place], [place], IT IS APPROXIMATELY 2 MINUTES PAST [employer] ON THE RIGHT AND REMAIN IN YOUR CAR, THIS IS A DRIVE UP TEST.       Your procedure is scheduled on 02-10-2021  Report to [facility].   Call this number if you have problems the morning of surgery  :[phone].   OUR ADDRESS IS [address].  WE ARE LOCATED IN [facility].  PLEASE BRING YOUR INSURANCE CARD AND PHOTO ID DAY OF SURGERY.  ONLY ONE PERSON ALLOWED IN FACILITY WAITING AREA.                                     REMEMBER:  DO NOT EAT FOOD, CANDY GUM OR MINTS  AFTER MIDNIGHT . YOU MAY HAVE CLEAR LIQUIDS FROM MIDNIGHT UNTIL 930 AM. NO CLEAR LIQUIDS AFTER 1030 AM DAY OF SURGERY.   YOU MAY  BRUSH YOUR TEETH MORNING OF SURGERY AND RINSE YOUR MOUTH OUT, NO CHEWING GUM CANDY OR MINTS.    CLEAR LIQUID DIET   Foods Allowed                                                                     Foods Excluded  Coffee and tea, regular and decaf                             liquids that you cannot  Plain Jell-O any favor except red or purple                                           see through such as: Fruit ices (not with fruit pulp)                                     milk, soups, orange juice  Iced Popsicles                                    All solid food Carbonated beverages, regular and diet                                    Cranberry, grape and apple juices Sports drinks like Gatorade Lightly seasoned clear broth or consume(fat free) Sugar, honey syrup  Sample Menu Breakfast                                Lunch  Supper Cranberry juice                    Beef broth                            Chicken broth Jell-O                                     Grape juice                           Apple  juice Coffee or tea                        Jell-O                                      Popsicle                                                Coffee or tea                        Coffee or tea  _____________________________________________________________________     TAKE THESE MEDICATIONS MORNING OF SURGERY WITH A SIP OF WATER:  NONE.  ONE VISITOR IS ALLOWED IN WAITING ROOM ONLY DAY OF SURGERY.  NO VISITOR MAY SPEND THE NIGHT.  VISITOR ARE ALLOWED TO STAY UNTIL 800 PM.                                    DO NOT WEAR JEWERLY, MAKE UP. DO NOT WEAR LOTIONS, POWDERS, PERFUMES OR NAIL POLISH. DO NOT SHAVE FOR 24 HOURS PRIOR TO DAY OF SURGERY. MEN MAY SHAVE FACE AND NECK. CONTACTS, GLASSES, OR DENTURES MAY NOT BE WORN TO SURGERY.                                    [facility] IS NOT RESPONSIBLE  FOR ANY BELONGINGS.                                                                    Marland Kitchen           [facility] - Preparing for Surgery Before surgery, you can play an important role.  Because skin is not sterile, your skin needs to be as free of germs as possible.  You can reduce the number of germs on your skin by washing with CHG (chlorahexidine gluconate) soap before surgery.  CHG is an antiseptic cleaner which kills germs and bonds with the skin to continue killing germs even after washing. Please DO NOT use if you have an allergy to CHG or antibacterial soaps.  If your skin becomes reddened/irritated  stop using the CHG and inform your nurse when you arrive at Short Stay. Do not shave (including legs and underarms) for at least 48 hours prior to the first CHG shower.  You may shave your face/neck. Please follow these instructions carefully:  1.  Shower with CHG Soap the night before surgery and the  morning of Surgery.  2.  If you choose to wash your hair, wash your hair first as usual with your  normal  shampoo.  3.  After you shampoo, rinse your hair and body thoroughly to remove the  shampoo.                             4.  Use CHG as you would any other liquid soap.  You can apply chg directly  to the skin and wash                      Gently with a scrungie or clean washcloth.  5.  Apply the CHG Soap to your body ONLY FROM THE NECK DOWN.   Do not use on face/ open                           Wound or open sores. Avoid contact with eyes, ears mouth and genitals (private parts).                       Wash face,  Genitals (private parts) with your normal soap.             6.  Wash thoroughly, paying special attention to the area where your surgery  will be performed.  7.  Thoroughly rinse your body with warm water from the neck down.  8.  DO NOT shower/wash with your normal soap after using and rinsing off  the CHG Soap.                9.  Pat yourself dry with a clean towel.            10.  Wear clean pajamas.            11.  Place clean sheets on your bed the night of your first shower and do not  sleep with pets. Day of Surgery : Do not apply any lotions/deodorants the morning of surgery.  Please wear clean clothes to the hospital/surgery center.  FAILURE TO FOLLOW THESE INSTRUCTIONS MAY RESULT IN THE CANCELLATION OF YOUR SURGERY PATIENT SIGNATURE_________________________________  NURSE SIGNATURE__________________________________  ________________________________________________________________________                                                        QUESTIONS Sandra Vargas PRE OP NURSE PHONE [phone].

## 2021-02-06 ENCOUNTER — Encounter (HOSPITAL_COMMUNITY)
Admission: RE | Admit: 2021-02-06 | Discharge: 2021-02-06 | Disposition: A | Discharge status: Home / Self Care | Payer: BC Managed Care – PPO | Source: Ambulatory Visit | Attending: Obstetrics and Gynecology | Admitting: Obstetrics and Gynecology

## 2021-02-06 ENCOUNTER — Other Ambulatory Visit (HOSPITAL_COMMUNITY)
Admission: RE | Admit: 2021-02-06 | Discharge: 2021-02-06 | Disposition: A | Discharge status: Home / Self Care | Payer: BC Managed Care – PPO | Source: Ambulatory Visit | Attending: Obstetrics and Gynecology | Admitting: Obstetrics and Gynecology

## 2021-02-06 DIAGNOSIS — Z01812 Encounter for preprocedural laboratory examination: Secondary | ICD-10-CM | POA: Insufficient documentation

## 2021-02-06 DIAGNOSIS — U071 COVID-19: Secondary | ICD-10-CM | POA: Insufficient documentation

## 2021-02-06 LAB — CBC
HCT: 41.3 % (ref 36.0–46.0)
Hemoglobin: 13.9 g/dL (ref 12.0–15.0)
MCH: 30.5 pg (ref 26.0–34.0)
MCHC: 33.7 g/dL (ref 30.0–36.0)
MCV: 90.6 fL (ref 80.0–100.0)
Platelets: 235 10*3/uL (ref 150–400)
RBC: 4.56 MIL/uL (ref 3.87–5.11)
RDW: 12 % (ref 11.5–15.5)
WBC: 6.7 10*3/uL (ref 4.0–10.5)
nRBC: 0 % (ref 0.0–0.2)

## 2021-02-06 LAB — SARS CORONAVIRUS 2 (TAT 6-24 HRS): SARS Coronavirus 2: POSITIVE — AB

## 2021-02-09 ENCOUNTER — Encounter (HOSPITAL_COMMUNITY): Payer: Self-pay | Admitting: Anesthesiology

## 2021-02-09 NOTE — Anesthesia Preprocedure Evaluation (Deleted)
Anesthesia Evaluation  Patient identified by MRN, date of birth, ID band  Reviewed: Allergy & Precautions, Patient's Chart, lab work & pertinent test results  History of Anesthesia Complications (+) PONV  Airway        Dental   Pulmonary neg pulmonary ROS,           Cardiovascular negative cardio ROS       Neuro/Psych negative neurological ROS     GI/Hepatic negative GI ROS, Neg liver ROS,   Endo/Other  negative endocrine ROS  Renal/GU negative Renal ROS     Musculoskeletal negative musculoskeletal ROS (+)   Abdominal   Peds  Hematology Lab Results      Component                Value               Date                      WBC                      6.7                 02/06/2021                HGB                      13.9                02/06/2021                HCT                      41.3                02/06/2021                MCV                      90.6                02/06/2021                PLT                      235                 02/06/2021             T& S available   Anesthesia Other Findings ALL : Codeine  Reproductive/Obstetrics menorrhagia                             Anesthesia Physical Anesthesia Plan  ASA: 3  Anesthesia Plan: General   Post-op Pain Management:    Induction: Intravenous  PONV Risk Score and Plan: 4 or greater and Treatment may vary due to age or medical condition, Ondansetron, Midazolam, Dexamethasone and Scopolamine patch - Pre-op  Airway Management Planned: Oral ETT  Additional Equipment: None  Intra-op Plan:   Post-operative Plan: Extubation in OR  Informed Consent:     Dental advisory given  Plan Discussed with:   Anesthesia Plan Comments: (GA w ETT)        Anesthesia Quick Evaluation

## 2021-02-10 LAB — TYPE AND SCREEN
ABO/RH(D): A POS
Antibody Screen: NEGATIVE

## 2021-02-10 NOTE — H&P (Signed)
Sandra Vargas is an 45 y.o. female. P3 with lifelong history of heavy, painful periods. Has failed management with OCPs and Mirena IUD. Planned Novasure was aborted due to uterine perforation. She now wishes to proceed with hysterectomy for definitive management.   In April, after IUD removal/failed ablation, she was put on OCP taper due to menorrhagia which temporarily improved her bleeding.   10/30/20 endometrial curettings pathology:  -  Polypoid fragment of benign endometrium suggestive of endometrial polyp  -  Fragments of decidualized endometrium  -  No hyperplasia or malignancy identified   09/08/20 pelvic US:  AV uterus with endometrial thickness measuring 6.24 mm. IUD noted within endometrial cavity within the appropriate location. Within the anterior myometrium a 2.14 x 1.32 x 2.21cm mass noted without increased vascularity c/w a fibroid. Bilateral adnexa noted, normal appearing. ROV with simple cyst c/w CL. No FF in CDS. Bilateral parametrium obscured by overlying bowel gas.   Patient's last menstrual period was 01/22/2021 (approximate).    Past Medical History:  Diagnosis Date   Anxiety    Cancer ([place]) 2012   melanoma area removed fron left calf   COVID 02/2020   sinus infection cough sob fever body aches h/a x 2 weeks all symptoms resolved   Dental crown present    Family history of adverse reaction to anesthesia    pt's mother has hx. of post-op N/V   Menorrhagia 02/04/2021   PONV (postoperative nausea and vomiting)    iv meds help with ponv    Past Surgical History:  Procedure Laterality Date   APPENDECTOMY  2007   laparoscopic   BREAST DUCTAL SYSTEM EXCISION Right 03/14/2018   Procedure: RIGHT BREAST CENTRAL DUCT EXCISION ERAS PATHWAY;  Surgeon: Erroll Luna, MD;  Location: [facility];  Service: General;  Laterality: Right;   CHOLECYSTECTOMY  yrs ago   laparoscopic   DILATION AND EVACUATION  08/14/2009   DILITATION & CURRETTAGE/HYSTROSCOPY  WITH NOVASURE ABLATION N/A 10/30/2020   Procedure: DILATATION & CURETTAGE/HYSTEROSCOPY;  Surgeon: Vanessa Kick, MD;  Location: [facility];  Service: Gynecology;  Laterality: N/A;   IUD REMOVAL  10/30/2020   Procedure: INTRAUTERINE DEVICE (IUD) REMOVAL;  Surgeon: Vanessa Kick, MD;  Location: [facility];  Service: Gynecology;;   TUBAL LIGATION  08/14/2009   WRIST MASS EXCISION Right 11/18/2016    Family History  Problem Relation Age of Onset   Breast cancer Maternal Aunt    Anesthesia problems Mother        post-op N/V    Social History:  reports that she has never smoked. She has never used smokeless tobacco. She reports that she does not drink alcohol and does not use drugs.  Allergies:  Allergies  Allergen Reactions   Codeine Nausea And Vomiting    No medications prior to admission.    Review of Systems  Constitutional:  Negative for fever.  Respiratory:  Negative for shortness of breath.   Cardiovascular:  Negative for chest pain.  Gastrointestinal:  Negative for abdominal pain.  Genitourinary:  Positive for menstrual problem.  All other systems reviewed and are negative.  Height 5\' 9"  (1.753 m), weight 90.7 kg, last menstrual period 01/22/2021. Physical Exam 01/14/21 exam Constitutional General Appearance: healthy-appearing, well-nourished, well-developed  Psychiatric Orientation: to time, to place, to person Mood and Affect: active and alert, normal mood, normal affect  Abdomen Auscultation/Inspection/Palpation: normal bowel sounds, soft, non-distended, no tenderness, no hepatomegaly, no splenomegaly, no masses, no CVA tenderness (multiple well healed  laparoscopic incisions) Hernia: none palpated  Female Genitalia Vulva: no masses, no atrophy, no lesions Bladder/Urethra: normal meatus, no urethral discharge, no urethral mass, bladder non distended Vagina no tenderness, no erythema, no abnormal vaginal discharge, no vesicle(s) or  ulcers, no cystocele, no rectocele Cervix: grossly normal, no discharge, no cervical motion tenderness Uterus: normal size, normal shape, midline, mobile, non-tender, no uterine prolapse Adnexa/Parametria: no parametrial tenderness, no parametrial mass, no adnexal tenderness, no ovarian mass No results found for this or any previous visit (from the past 24 hour(s)).  No results found.  Assessment/Plan: 45Y P3 with long history of menorrhagia and dysmenorrhea, failed medical management, failed ablation - Plan for exam under anesthesia, RA-TLH, bilateral salpingectomy, cystoscopy. Plan for ovarian conservation unless gross abnormality during surgery.  - Ancef 2g  Rowland Lathe 02/10/2021, 7:52 AM   ADDENDUM: Patient tested COVID+ during preoperative testing, surgery rescheduled.  MBrien Mates  02/13/21 12:40 PM

## 2021-02-23 ENCOUNTER — Encounter (HOSPITAL_BASED_OUTPATIENT_CLINIC_OR_DEPARTMENT_OTHER): Payer: Self-pay | Admitting: Obstetrics and Gynecology

## 2021-02-25 ENCOUNTER — Encounter (HOSPITAL_BASED_OUTPATIENT_CLINIC_OR_DEPARTMENT_OTHER): Payer: Self-pay | Admitting: Obstetrics and Gynecology

## 2021-02-25 ENCOUNTER — Other Ambulatory Visit: Payer: Self-pay

## 2021-02-25 DIAGNOSIS — R059 Cough, unspecified: Secondary | ICD-10-CM

## 2021-02-25 HISTORY — DX: Cough, unspecified: R05.9

## 2021-02-25 NOTE — Progress Notes (Addendum)
Spoke w/ via phone for pre-op interview---pt Lab needs dos----urine preg             Lab results------see below COVID test ----positive covid test 02-06-2021 result in epic Medications to take morning of surgery -----none Diabetic medication -----n/a Patient instructed no nail polish to be worn day of surgery Patient instructed to bring photo id and insurance card day of surgery Patient aware to have Driver (ride ) / caregiver   spouse tommy   for 24 hours after surgery  Patient Special Instructions -----pt given extended stay instructions Pre-Op special Istructions -----none Patient verbalized understanding of instructions that were given at this phone interview. Patient denies shortness of breath, chest pain, fever, cough at this phone interview.   Pt having chest x ray 03-09-2021  [facility] am[facility] [place] , pt to ask md office fo fax chest xray results asap.[facility]

## 2021-02-25 NOTE — Progress Notes (Signed)
Your procedure is scheduled on 03-10-2021  Report to [facility]   Call this number if you have problems the morning of surgery  [phone].   OUR ADDRESS IS [address].  [facility] ARE [facility]ATED [facility].  PLEASE BRING YOUR INSURANCE CARD AND PHOTO ID DAY OF SURGERY.  ONLY ONE PERSON ALLOWED IN FACILITY WAITING AREA.                                     REMEMBER:  DO NOT EAT FOOD, CANDY GUM OR MINTS  AFTER MIDNIGHT . YOU MAY HAVE CLEAR LIQUIDS FROM MIDNIGHT UNTIL 930 AM. NO CLEAR LIQUIDS AFTER 1030 AM DAY OF SURGERY.   YOU MAY  BRUSH YOUR TEETH MORNING OF SURGERY AND RINSE YOUR MOUTH OUT, NO CHEWING GUM CANDY OR MINTS.    CLEAR LIQUID DIET   Foods Allowed                                                                     Foods Excluded  Coffee and tea, regular and decaf                             liquids that you cannot  Plain Jell-O any favor except red or purple                                           see through such as: Fruit ices (not with fruit pulp)                                     milk, soups, orange juice  Iced Popsicles                                    All solid food Carbonated beverages, regular and diet                                    Cranberry, grape and apple juices Sports drinks like Gatorade Lightly seasoned clear broth or consume(fat free) Sugar, honey syrup  Sample Menu Breakfast                                Lunch                                     Supper Cranberry juice                    Beef broth  Chicken broth Jell-O                                     Grape juice                           Apple juice Coffee or tea                        Jell-O                                      Popsicle                                                Coffee or tea                        Coffee or  tea  _____________________________________________________________________     TAKE THESE MEDICATIONS MORNING OF SURGERY WITH A SIP OF WATER:  NONE.  ONE VISITOR IS ALLOWED IN WAITING ROOM ONLY DAY OF SURGERY.  NO VISITOR MAY SPEND THE NIGHT.  VISITOR ARE ALLOWED TO STAY UNTIL 800 PM.                                    DO NOT WEAR JEWERLY, MAKE UP. DO NOT WEAR LOTIONS, POWDERS, PERFUMES OR NAIL POLISH. DO NOT SHAVE FOR 24 HOURS PRIOR TO DAY OF SURGERY. MEN MAY SHAVE FACE AND NECK. CONTACTS, GLASSES, OR DENTURES MAY NOT BE WORN TO SURGERY.                                    [facility] IS NOT RESPONSIBLE  FOR ANY BELONGINGS.                                                                    Marland Kitchen           [facility] - Preparing for Surgery Before surgery, you can play an important role.  Because skin is not sterile, your skin needs to be as free of germs as possible.  You can reduce the number of germs on your skin by washing with CHG (chlorahexidine gluconate) soap before surgery.  CHG is an antiseptic cleaner which kills germs and bonds with the skin to continue killing germs even after washing. Please DO NOT use if you have an allergy to CHG or antibacterial soaps.  If your skin becomes reddened/irritated stop using the CHG and inform your nurse when you arrive at Short Stay. Do not shave (including legs and underarms) for at least 48 hours prior to the first CHG shower.  You may shave your face/neck. Please follow these instructions carefully:  1.  Shower with CHG Soap the  night before surgery and the  morning of Surgery.  2.  If you choose to wash your hair, wash your hair first as usual with your  normal  shampoo.  3.  After you shampoo, rinse your hair and body thoroughly to remove the  shampoo.                            4.  Use CHG as you would any other liquid soap.  You can apply chg directly  to the skin and wash                      Gently with a scrungie or clean washcloth.  5.   Apply the CHG Soap to your body ONLY FROM THE NECK DOWN.   Do not use on face/ open                           Wound or open sores. Avoid contact with eyes, ears mouth and genitals (private parts).                       Wash face,  Genitals (private parts) with your normal soap.             6.  Wash thoroughly, paying special attention to the area where your surgery  will be performed.  7.  Thoroughly rinse your body with warm water from the neck down.  8.  DO NOT shower/wash with your normal soap after using and rinsing off  the CHG Soap.                9.  Pat yourself dry with a clean towel.            10.  Wear clean pajamas.            11.  Place clean sheets on your bed the night of your first shower and do not  sleep with pets. Day of Surgery : Do not apply any lotions/deodorants the morning of surgery.  Please wear clean clothes to the hospital/surgery center.  FAILURE TO FOLLOW THESE INSTRUCTIONS MAY RESULT IN THE CANCELLATION OF YOUR SURGERY PATIENT SIGNATURE_________________________________  NURSE SIGNATURE__________________________________  ________________________________________________________________________                                                        QUESTIONS Hansel Feinstein PRE OP NURSE PHONE [phone].

## 2021-03-06 ENCOUNTER — Other Ambulatory Visit: Payer: Self-pay

## 2021-03-06 ENCOUNTER — Encounter (HOSPITAL_COMMUNITY)
Admission: RE | Admit: 2021-03-06 | Discharge: 2021-03-06 | Disposition: A | Discharge status: Home / Self Care | Payer: BC Managed Care – PPO | Source: Ambulatory Visit | Attending: Obstetrics and Gynecology | Admitting: Obstetrics and Gynecology

## 2021-03-06 DIAGNOSIS — Z01812 Encounter for preprocedural laboratory examination: Secondary | ICD-10-CM | POA: Insufficient documentation

## 2021-03-06 LAB — CBC
HCT: 40.7 % (ref 36.0–46.0)
Hemoglobin: 13.7 g/dL (ref 12.0–15.0)
MCH: 31 pg (ref 26.0–34.0)
MCHC: 33.7 g/dL (ref 30.0–36.0)
MCV: 92.1 fL (ref 80.0–100.0)
Platelets: 260 10*3/uL (ref 150–400)
RBC: 4.42 MIL/uL (ref 3.87–5.11)
RDW: 12.4 % (ref 11.5–15.5)
WBC: 7.1 10*3/uL (ref 4.0–10.5)
nRBC: 0 % (ref 0.0–0.2)

## 2021-03-09 ENCOUNTER — Encounter (HOSPITAL_BASED_OUTPATIENT_CLINIC_OR_DEPARTMENT_OTHER): Payer: Self-pay | Admitting: Obstetrics and Gynecology

## 2021-03-10 ENCOUNTER — Ambulatory Visit (HOSPITAL_BASED_OUTPATIENT_CLINIC_OR_DEPARTMENT_OTHER): Payer: BC Managed Care – PPO | Admitting: Anesthesiology

## 2021-03-10 ENCOUNTER — Ambulatory Visit (HOSPITAL_BASED_OUTPATIENT_CLINIC_OR_DEPARTMENT_OTHER)
Admission: RE | Admit: 2021-03-10 | Discharge: 2021-03-10 | Disposition: A | Discharge status: Home / Self Care | Payer: BC Managed Care – PPO | Attending: Obstetrics and Gynecology | Admitting: Obstetrics and Gynecology

## 2021-03-10 ENCOUNTER — Encounter (HOSPITAL_BASED_OUTPATIENT_CLINIC_OR_DEPARTMENT_OTHER)
Admission: RE | Disposition: A | Discharge status: Home / Self Care | Payer: Self-pay | Source: Home / Self Care | Attending: Obstetrics and Gynecology

## 2021-03-10 ENCOUNTER — Encounter (HOSPITAL_BASED_OUTPATIENT_CLINIC_OR_DEPARTMENT_OTHER): Payer: Self-pay | Admitting: Obstetrics and Gynecology

## 2021-03-10 DIAGNOSIS — N920 Excessive and frequent menstruation with regular cycle: Secondary | ICD-10-CM | POA: Diagnosis present

## 2021-03-10 DIAGNOSIS — Z885 Allergy status to narcotic agent status: Secondary | ICD-10-CM | POA: Insufficient documentation

## 2021-03-10 DIAGNOSIS — D251 Intramural leiomyoma of uterus: Secondary | ICD-10-CM | POA: Insufficient documentation

## 2021-03-10 DIAGNOSIS — Z803 Family history of malignant neoplasm of breast: Secondary | ICD-10-CM | POA: Diagnosis not present

## 2021-03-10 DIAGNOSIS — Z90710 Acquired absence of both cervix and uterus: Secondary | ICD-10-CM | POA: Insufficient documentation

## 2021-03-10 DIAGNOSIS — Z9049 Acquired absence of other specified parts of digestive tract: Secondary | ICD-10-CM | POA: Insufficient documentation

## 2021-03-10 DIAGNOSIS — Z8616 Personal history of COVID-19: Secondary | ICD-10-CM | POA: Diagnosis not present

## 2021-03-10 DIAGNOSIS — N946 Dysmenorrhea, unspecified: Secondary | ICD-10-CM | POA: Diagnosis not present

## 2021-03-10 DIAGNOSIS — Z85820 Personal history of malignant melanoma of skin: Secondary | ICD-10-CM | POA: Diagnosis not present

## 2021-03-10 HISTORY — PX: ROBOTIC ASSISTED TOTAL HYSTERECTOMY WITH BILATERAL SALPINGO OOPHERECTOMY: SHX6086

## 2021-03-10 HISTORY — DX: Pneumonia, unspecified organism: J18.9

## 2021-03-10 HISTORY — PX: CYSTOSCOPY: SHX5120

## 2021-03-10 LAB — TYPE AND SCREEN
ABO/RH(D): A POS
Antibody Screen: NEGATIVE

## 2021-03-10 LAB — POCT PREGNANCY, URINE: Preg Test, Ur: NEGATIVE

## 2021-03-10 SURGERY — HYSTERECTOMY, TOTAL, ROBOT-ASSISTED, LAPAROSCOPIC, WITH BILATERAL SALPINGO-OOPHORECTOMY
Anesthesia: General | Site: Bladder

## 2021-03-10 SURGERY — HYSTERECTOMY, TOTAL, ROBOT-ASSISTED, LAPAROSCOPIC, WITH BILATERAL SALPINGO-OOPHORECTOMY
Anesthesia: General

## 2021-03-10 MED ORDER — SUGAMMADEX SODIUM 200 MG/2ML IV SOLN
INTRAVENOUS | Status: DC | PRN
  Administered 2021-03-10: 400 mg via INTRAVENOUS

## 2021-03-10 MED ORDER — MIDAZOLAM HCL 2 MG/2ML IJ SOLN
INTRAMUSCULAR | Status: AC
  Filled 2021-03-10: qty 2

## 2021-03-10 MED ORDER — LACTATED RINGERS IV SOLN: INTRAVENOUS | Status: DC

## 2021-03-10 MED ORDER — OXYCODONE HCL 5 MG PO TABS: 5.0000 mg | ORAL_TABLET | Freq: Once | ORAL | Status: DC | PRN

## 2021-03-10 MED ORDER — SODIUM CHLORIDE 0.9 % IV SOLN
INTRAVENOUS | Status: AC
  Filled 2021-03-10: qty 2

## 2021-03-10 MED ORDER — ACETAMINOPHEN 500 MG PO TABS
ORAL_TABLET | ORAL | Status: AC
  Filled 2021-03-10: qty 2

## 2021-03-10 MED ORDER — STERILE WATER FOR IRRIGATION IR SOLN
Status: DC | PRN
  Administered 2021-03-10: 3000 mL via INTRAVESICAL

## 2021-03-10 MED ORDER — DEXMEDETOMIDINE (PRECEDEX) IN NS 20 MCG/5ML (4 MCG/ML) IV SYRINGE
PREFILLED_SYRINGE | INTRAVENOUS | Status: DC | PRN
  Administered 2021-03-10: 12 ug via INTRAVENOUS
  Administered 2021-03-10: 8 ug via INTRAVENOUS

## 2021-03-10 MED ORDER — POVIDONE-IODINE 10 % EX SWAB: 2.0000 "application " | Freq: Once | CUTANEOUS | Status: DC

## 2021-03-10 MED ORDER — KETOROLAC TROMETHAMINE 30 MG/ML IJ SOLN
INTRAMUSCULAR | Status: DC | PRN
  Administered 2021-03-10: 30 mg via INTRAVENOUS

## 2021-03-10 MED ORDER — AMISULPRIDE (ANTIEMETIC) 5 MG/2ML IV SOLN: 10.0000 mg | Freq: Once | INTRAVENOUS | Status: DC | PRN

## 2021-03-10 MED ORDER — HYDROMORPHONE HCL 1 MG/ML IJ SOLN: 1.0000 mg | INTRAMUSCULAR | Status: DC | PRN

## 2021-03-10 MED ORDER — IBUPROFEN 600 MG PO TABS: 600.0000 mg | ORAL_TABLET | Freq: Four times a day (QID) | ORAL | 1 refills | Status: AC | PRN

## 2021-03-10 MED ORDER — OXYCODONE HCL 5 MG/5ML PO SOLN: 5.0000 mg | Freq: Once | ORAL | Status: DC | PRN

## 2021-03-10 MED ORDER — ACETAMINOPHEN 500 MG PO TABS: 1000.0000 mg | ORAL_TABLET | Freq: Once | ORAL | Status: DC

## 2021-03-10 MED ORDER — FENTANYL CITRATE (PF) 100 MCG/2ML IJ SOLN
INTRAMUSCULAR | Status: DC | PRN
  Administered 2021-03-10 (×3): 50 ug via INTRAVENOUS
  Administered 2021-03-10: 100 ug via INTRAVENOUS

## 2021-03-10 MED ORDER — KETOROLAC TROMETHAMINE 15 MG/ML IJ SOLN: 15.0000 mg | INTRAMUSCULAR | Status: DC

## 2021-03-10 MED ORDER — FENTANYL CITRATE (PF) 100 MCG/2ML IJ SOLN: 25.0000 ug | INTRAMUSCULAR | Status: DC | PRN

## 2021-03-10 MED ORDER — PROPOFOL 10 MG/ML IV BOLUS
INTRAVENOUS | Status: AC
  Filled 2021-03-10: qty 20

## 2021-03-10 MED ORDER — ONDANSETRON HCL 4 MG/2ML IJ SOLN
INTRAMUSCULAR | Status: DC | PRN
  Administered 2021-03-10: 4 mg via INTRAVENOUS

## 2021-03-10 MED ORDER — ACETAMINOPHEN 500 MG PO TABS
1000.0000 mg | ORAL_TABLET | ORAL | Status: AC
  Administered 2021-03-10: 1000 mg via ORAL

## 2021-03-10 MED ORDER — DEXAMETHASONE SODIUM PHOSPHATE 10 MG/ML IJ SOLN
INTRAMUSCULAR | Status: DC | PRN
  Administered 2021-03-10: 10 mg via INTRAVENOUS

## 2021-03-10 MED ORDER — ONDANSETRON HCL 4 MG/2ML IJ SOLN
INTRAMUSCULAR | Status: AC
  Filled 2021-03-10: qty 2

## 2021-03-10 MED ORDER — ONDANSETRON 4 MG PO TBDP: 4.0000 mg | ORAL_TABLET | Freq: Four times a day (QID) | ORAL | Status: DC | PRN

## 2021-03-10 MED ORDER — DOCUSATE SODIUM 100 MG PO CAPS: 100.0000 mg | ORAL_CAPSULE | Freq: Two times a day (BID) | ORAL | Status: DC

## 2021-03-10 MED ORDER — SOD CITRATE-CITRIC ACID 500-334 MG/5ML PO SOLN: 30.0000 mL | ORAL | Status: DC

## 2021-03-10 MED ORDER — FENTANYL CITRATE (PF) 250 MCG/5ML IJ SOLN
INTRAMUSCULAR | Status: AC
  Filled 2021-03-10: qty 5

## 2021-03-10 MED ORDER — KETOROLAC TROMETHAMINE 30 MG/ML IJ SOLN
INTRAMUSCULAR | Status: AC
  Filled 2021-03-10: qty 1

## 2021-03-10 MED ORDER — SODIUM CHLORIDE 0.9 % IV SOLN
2.0000 g | INTRAVENOUS | Status: AC
  Administered 2021-03-10: 2 g via INTRAVENOUS

## 2021-03-10 MED ORDER — SODIUM CHLORIDE 0.9 % IV SOLN
INTRAVENOUS | Status: DC | PRN
  Administered 2021-03-10: 60 mL

## 2021-03-10 MED ORDER — IBUPROFEN 200 MG PO TABS: 600.0000 mg | ORAL_TABLET | Freq: Four times a day (QID) | ORAL | Status: DC | PRN

## 2021-03-10 MED ORDER — DEXAMETHASONE SODIUM PHOSPHATE 10 MG/ML IJ SOLN
INTRAMUSCULAR | Status: AC
  Filled 2021-03-10: qty 1

## 2021-03-10 MED ORDER — ROCURONIUM BROMIDE 10 MG/ML (PF) SYRINGE
PREFILLED_SYRINGE | INTRAVENOUS | Status: AC
  Filled 2021-03-10: qty 10

## 2021-03-10 MED ORDER — LIDOCAINE 2% (20 MG/ML) 5 ML SYRINGE
INTRAMUSCULAR | Status: DC | PRN
  Administered 2021-03-10: 70 mg via INTRAVENOUS

## 2021-03-10 MED ORDER — INDIGOTINDISULFONATE SODIUM 8 MG/ML IJ SOLN
INTRAMUSCULAR | Status: DC | PRN
  Administered 2021-03-10: 5 mL via INTRAVENOUS

## 2021-03-10 MED ORDER — SCOPOLAMINE 1 MG/3DAYS TD PT72
1.0000 | MEDICATED_PATCH | TRANSDERMAL | Status: DC
  Administered 2021-03-10: 1.5 mg via TRANSDERMAL

## 2021-03-10 MED ORDER — MIDAZOLAM HCL 5 MG/5ML IJ SOLN
INTRAMUSCULAR | Status: DC | PRN
  Administered 2021-03-10: 2 mg via INTRAVENOUS

## 2021-03-10 MED ORDER — OXYCODONE HCL 5 MG PO TABS: 5.0000 mg | ORAL_TABLET | ORAL | Status: DC | PRN

## 2021-03-10 MED ORDER — ACETAMINOPHEN 500 MG PO TABS
1000.0000 mg | ORAL_TABLET | Freq: Four times a day (QID) | ORAL | Status: DC
  Administered 2021-03-10: 1000 mg via ORAL

## 2021-03-10 MED ORDER — PROPOFOL 10 MG/ML IV BOLUS
INTRAVENOUS | Status: DC | PRN
  Administered 2021-03-10: 160 mg via INTRAVENOUS
  Administered 2021-03-10: 40 mg via INTRAVENOUS

## 2021-03-10 MED ORDER — PROMETHAZINE HCL 25 MG/ML IJ SOLN: 6.2500 mg | INTRAMUSCULAR | Status: DC | PRN

## 2021-03-10 MED ORDER — ONDANSETRON HCL 4 MG/2ML IJ SOLN
4.0000 mg | Freq: Four times a day (QID) | INTRAMUSCULAR | Status: DC | PRN
  Administered 2021-03-10: 4 mg via INTRAVENOUS

## 2021-03-10 MED ORDER — OXYCODONE HCL 5 MG PO TABS: 5.0000 mg | ORAL_TABLET | ORAL | 0 refills | Status: AC | PRN

## 2021-03-10 MED ORDER — LIDOCAINE HCL (PF) 2 % IJ SOLN
INTRAMUSCULAR | Status: AC
  Filled 2021-03-10: qty 5

## 2021-03-10 MED ORDER — SCOPOLAMINE 1 MG/3DAYS TD PT72
MEDICATED_PATCH | TRANSDERMAL | Status: AC
  Filled 2021-03-10: qty 1

## 2021-03-10 MED ORDER — ROCURONIUM BROMIDE 10 MG/ML (PF) SYRINGE
PREFILLED_SYRINGE | INTRAVENOUS | Status: DC | PRN
  Administered 2021-03-10: 70 mg via INTRAVENOUS
  Administered 2021-03-10 (×2): 20 mg via INTRAVENOUS

## 2021-03-10 SURGICAL SUPPLY — 63 items
ADH SKN CLS APL DERMABOND .7 (GAUZE/BANDAGES/DRESSINGS) ×2
APL SRG 38 LTWT LNG FL B (MISCELLANEOUS) ×2
APPLICATOR ARISTA FLEXITIP XL (MISCELLANEOUS) ×1 IMPLANT
BARRIER ADHS 3X4 INTERCEED (GAUZE/BANDAGES/DRESSINGS) IMPLANT
BRR ADH 4X3 ABS CNTRL BYND (GAUZE/BANDAGES/DRESSINGS)
COVER BACK TABLE 60X90IN (DRAPES) ×3 IMPLANT
COVER TIP SHEARS 8 DVNC (MISCELLANEOUS) ×2 IMPLANT
COVER TIP SHEARS 8MM DA VINCI (MISCELLANEOUS) ×3
DECANTER SPIKE VIAL GLASS SM (MISCELLANEOUS) ×3 IMPLANT
DEFOGGER SCOPE WARMER CLEARIFY (MISCELLANEOUS) ×3 IMPLANT
DERMABOND ADVANCED (GAUZE/BANDAGES/DRESSINGS) ×1
DERMABOND ADVANCED .7 DNX12 (GAUZE/BANDAGES/DRESSINGS) ×2 IMPLANT
DRAPE ARM DVNC X/XI (DISPOSABLE) ×8 IMPLANT
DRAPE COLUMN DVNC XI (DISPOSABLE) ×2 IMPLANT
DRAPE DA VINCI XI ARM (DISPOSABLE) ×12
DRAPE DA VINCI XI COLUMN (DISPOSABLE) ×3
DRAPE UTILITY XL STRL (DRAPES) ×3 IMPLANT
DURAPREP 26ML APPLICATOR (WOUND CARE) ×3 IMPLANT
ELECT REM PT RETURN 9FT ADLT (ELECTROSURGICAL) ×3
ELECTRODE REM PT RTRN 9FT ADLT (ELECTROSURGICAL) ×2 IMPLANT
GAUZE 4X4 16PLY ~~LOC~~+RFID DBL (SPONGE) ×6 IMPLANT
GLOVE SRG 8 PF TXTR STRL LF DI (GLOVE) ×2 IMPLANT
GLOVE SURG ENC MOIS LTX SZ6 (GLOVE) ×4 IMPLANT
GLOVE SURG ENC MOIS LTX SZ7 (GLOVE) ×8 IMPLANT
GLOVE SURG UNDER POLY LF SZ6 (GLOVE) ×3 IMPLANT
GLOVE SURG UNDER POLY LF SZ8 (GLOVE)
HEMOSTAT ARISTA ABSORB 3G PWDR (HEMOSTASIS) ×1 IMPLANT
HOLDER FOLEY CATH W/STRAP (MISCELLANEOUS) IMPLANT
IRRIG SUCT STRYKERFLOW 2 WTIP (MISCELLANEOUS) ×3
IRRIGATION SUCT STRKRFLW 2 WTP (MISCELLANEOUS) ×2 IMPLANT
KIT TURNOVER CYSTO (KITS) ×3 IMPLANT
LEGGING LITHOTOMY PAIR STRL (DRAPES) ×3 IMPLANT
MANIPULATOR ADVINCU DEL 2.5 PL (MISCELLANEOUS) IMPLANT
MANIPULATOR ADVINCU DEL 3.0 PL (MISCELLANEOUS) ×1 IMPLANT
MANIPULATOR ADVINCU DEL 3.5 PL (MISCELLANEOUS) IMPLANT
MANIPULATOR ADVINCU DEL 4.0 PL (MISCELLANEOUS) IMPLANT
NEEDLE INSUFFLATION 120MM (ENDOMECHANICALS) ×3 IMPLANT
OBTURATOR OPTICAL STANDARD 8MM (TROCAR) ×3
OBTURATOR OPTICAL STND 8 DVNC (TROCAR) ×2
OBTURATOR OPTICALSTD 8 DVNC (TROCAR) ×2 IMPLANT
PACK ROBOT WH (CUSTOM PROCEDURE TRAY) ×3 IMPLANT
PACK ROBOTIC GOWN (GOWN DISPOSABLE) ×3 IMPLANT
PACK TRENDGUARD 450 HYBRID PRO (MISCELLANEOUS) IMPLANT
PAD OB MATERNITY 4.3X12.25 (PERSONAL CARE ITEMS) ×3 IMPLANT
PAD PREP 24X48 CUFFED NSTRL (MISCELLANEOUS) ×3 IMPLANT
POUCH LAPAROSCOPIC INSTRUMENT (MISCELLANEOUS) IMPLANT
PROTECTOR NERVE ULNAR (MISCELLANEOUS) ×4 IMPLANT
SEAL CANN UNIV 5-8 DVNC XI (MISCELLANEOUS) ×6 IMPLANT
SEAL XI 5MM-8MM UNIVERSAL (MISCELLANEOUS) ×9
SET IRRIG Y TYPE TUR BLADDER L (SET/KITS/TRAYS/PACK) ×3 IMPLANT
SET TRI-LUMEN FLTR TB AIRSEAL (TUBING) ×3 IMPLANT
SPONGE T-LAP 4X18 ~~LOC~~+RFID (SPONGE) ×2 IMPLANT
SUT VIC AB 0 CT1 36 (SUTURE) ×3 IMPLANT
SUT VICRYL RAPIDE 3 0 (SUTURE) ×6 IMPLANT
SUT VLOC 180 0 9IN  GS21 (SUTURE) ×3
SUT VLOC 180 0 9IN GS21 (SUTURE) ×2 IMPLANT
SYR 50ML LL SCALE MARK (SYRINGE) ×1 IMPLANT
TOWEL OR 17X26 10 PK STRL BLUE (TOWEL DISPOSABLE) ×3 IMPLANT
TRAY FOLEY W/BAG SLVR 14FR LF (SET/KITS/TRAYS/PACK) ×3 IMPLANT
TRENDGUARD 450 HYBRID PRO PACK (MISCELLANEOUS) ×3
TROCAR BLADELESS OPT 5 100 (ENDOMECHANICALS) IMPLANT
TROCAR PORT AIRSEAL 5X120 (TROCAR) ×3 IMPLANT
WATER STERILE IRR 1000ML POUR (IV SOLUTION) ×3 IMPLANT

## 2021-03-10 NOTE — Anesthesia Postprocedure Evaluation (Signed)
Anesthesia Post Note  Patient: Sandra Vargas  Procedure(s) Performed: XI ROBOTIC ASSISTED TOTAL HYSTERECTOMY WITH BILATERAL SALPINGECTOMY (Bilateral: Abdomen) CYSTOSCOPY (Bladder)     Patient location during evaluation: PACU Anesthesia Type: General Level of consciousness: awake Pain management: pain level controlled Vital Signs Assessment: post-procedure vital signs reviewed and stable Respiratory status: spontaneous breathing and respiratory function stable Cardiovascular status: stable Postop Assessment: no apparent nausea or vomiting Anesthetic complications: no   No notable events documented.  Last Vitals:  Vitals:   03/10/21 1430 03/10/21 1502  BP: 120/65 110/66  Pulse: (!) 58 (!) 58  Resp: 13 12  Temp: (!) 36.3 C (!) 36.3 C  SpO2: 99% 98%    Last Pain:  Vitals:   03/10/21 1430  PainSc: 0-No pain                 Candra R Ryman Rathgeber

## 2021-03-10 NOTE — Anesthesia Preprocedure Evaluation (Addendum)
Anesthesia Evaluation  Patient identified by MRN, date of birth, ID band Patient awake    Reviewed: Allergy & Precautions, NPO status , Patient's Chart, lab work & pertinent test results  History of Anesthesia Complications (+) PONV and history of anesthetic complications  Airway Mallampati: II  TM Distance: >3 FB Neck ROM: Full    Dental no notable dental hx.  Top front 4 teeth are veneers:   Pulmonary pneumonia, resolved,  covid 02/06/21 with pneumonia   Pulmonary exam normal breath sounds clear to auscultation       Cardiovascular negative cardio ROS Normal cardiovascular exam Rhythm:Regular Rate:Normal     Neuro/Psych PSYCHIATRIC DISORDERS Anxiety negative neurological ROS     GI/Hepatic negative GI ROS, Neg liver ROS,   Endo/Other  negative endocrine ROS  Renal/GU negative Renal ROS  negative genitourinary   Musculoskeletal negative musculoskeletal ROS (+)   Abdominal   Peds negative pediatric ROS (+)  Hematology negative hematology ROS (+)   Anesthesia Other Findings   Reproductive/Obstetrics menorrhagia                           Anesthesia Physical Anesthesia Plan  ASA: 2  Anesthesia Plan: General   Post-op Pain Management:    Induction: Intravenous  PONV Risk Score and Plan: 4 or greater and Treatment may vary due to age or medical condition, Ondansetron, Dexamethasone and Midazolam  Airway Management Planned: Oral ETT  Additional Equipment:   Intra-op Plan:   Post-operative Plan: Extubation in OR  Informed Consent: I have reviewed the patients History and Physical, chart, labs and discussed the procedure including the risks, benefits and alternatives for the proposed anesthesia with the patient or authorized representative who has indicated his/her understanding and acceptance.     Dental advisory given  Plan Discussed with: CRNA  Anesthesia Plan Comments:  (Chest xray on 8/1...resolving pneumonia. Patient now asymptomatic)      Anesthesia Quick Evaluation

## 2021-03-10 NOTE — Transfer of Care (Signed)
Immediate Anesthesia Transfer of Care Note  Patient: ELVETA KESKE  Procedure(s) Performed: XI ROBOTIC ASSISTED TOTAL HYSTERECTOMY WITH BILATERAL SALPINGECTOMY (Bilateral: Abdomen) CYSTOSCOPY (Bladder)  Patient Location: PACU  Anesthesia Type:General  Level of Consciousness: drowsy  Airway & Oxygen Therapy: Patient Spontanous Breathing and Patient connected to nasal cannula oxygen  Post-op Assessment: Report given to RN  Post vital signs: Reviewed and stable  Last Vitals:  Vitals Value Taken Time  BP 119/88 03/10/21 1400  Temp    Pulse 75 03/10/21 1401  Resp 15 03/10/21 1401  SpO2 98 % 03/10/21 1401  Vitals shown include unvalidated device data.  Last Pain:  Vitals:   03/10/21 0932  PainSc: 0-No pain      Patients Stated Pain Goal: 4 ([id number])  Complications: No notable events documented.

## 2021-03-10 NOTE — Op Note (Signed)
03/10/2021   1:49 PM   PATIENT:  Sandra Vargas  45 y.o. female    PRE-OPERATIVE DIAGNOSIS:  menorrhagia and dysmenorrhea, history of Filshie clip tubal ligation   POST-OPERATIVE DIAGNOSIS:  menorrhagia and dysmenorrhea, history of Filshie clip tubal ligation   PROCEDURE:  Procedure(s): XI ROBOTIC ASSISTED TOTAL HYSTERECTOMY WITH BILATERAL SALPINGECTOMY (Bilateral) CYSTOSCOPY (N/A)   SURGEON:  Surgeon(s) and Role:    * Rowland Lathe, MD - Primary    * Taam-Akelman, Lawrence Santiago, MD - Assisting     ANESTHESIA:   general   EBL:  30m   BLOOD ADMINISTERED:none   DRAINS: Urinary Catheter (Foley)    LOCAL MEDICATIONS USED:  Ropivicaine   SPECIMEN:  Source of Specimen:  uterus, cervix, bilateral fallopian tubes, filshie clips (x2)   DISPOSITION OF SPECIMEN:  PATHOLOGY   COUNTS:  YES   PLAN OF CARE: Discharge to home after PACU   PATIENT DISPOSITION:  PACU - hemodynamically stable.   FINDINGS:  On bimanual exam, globular uterus sounded to 8cm.  On laparoscopic view, globular appearing uterus with suspected 2-3 cm anterior intramural fibroid. Bilateral fallopian tubes status post tubal ligation. Left filshie clip in place, right filshie clip in posterior cul-de-sac. Normal appearing ovaries bilaterally. Normal appearing ureters bilaterally. Status post appendectomy.  Cystoscopy demonstrated intact bladder and bilateral ureteral jets.    DESCRIPTION OF PROCEDURE: After consent was verified the patient was taken to the operating room.  After adequate anesthesia was achieved the patient was positioned in the dorsal lithotomy position with legs in stirrups. SCDs were in place and cycling.  Exam revealed a 8 week size globular uterus. The patient was prepped and draped in normal sterile fashion. Ancef 2g was given for infection prophylaxis. A speculum was placed in the vagina and the anterior lip of the cervix was grasped with a tenaculum. A stay suture was placed on the  anterior lip of the cervix. The uterus was sounded to 8cm and the cervix [facility].  The 3.0 cm delineating ring was assembled and placed in the proper fashion.  The speculum was removed and the bladder was drained with a Foley catheter.   Attention was turned to the abdomen. An 828mincision was made 1 cm above the umbilicus and the Veress needle was inserted through the incision. Correct placement was confirmed by opening pressure of 58m76m. The abdomen was insufflated with CO2 gas to a total pressure of 158m71m The 8 mm robotic trochar was inserted into the abdominal cavity. The laparoscope was inserted and confirmed no vascular or visceral trauma from entry. The patient was placed in steep Trendelenburg.  Two additional 8mm 44motic trochars were placed, one on each side of the abdomen. An 58mm A23meal assistant port was placed on the left side. All trochars were inserted under direct visualization of the camera.  The robot was docked.  The fenestrated bipolar was placed on arm 1 and the monopolar scissors on arm 3 and introduced under direct visualization of the camera.   Survey of the abdomen revealed findings as noted above. Bilateral  ureters were visualized peristalsing. The left mesosalpinx was serially ligated to free the left fallopian tube. The same was repeated on the right side. The left round ligament was transected.  The vesicouterine peritoneum was opened and the bladder was dissected off the lower uterine segment and upper vagina.  The same was repeated on the right side. The left posterior broad ligament was skeletonized. The same process was repeated  on the right side. The left uterine vessels were dessicated with bipolar cautery and transected with monopolar scissors. The right uterine vessels were transected in the same fashion. The cervicovaginal junction was identified with the delineating ring and the monopolar scissors were used to enter the vagina and incise  circumferentially around the cervicovaginal junction. The uterus, cervix, bilateral fallopian tubes, and two Filshie clips were removed through the vagina and sent for pathology. The vaginal cuff was closed with V loc suture in a running fashion. The suture was removed. An assistant palpated the vaginal cuff from the vaginal field and confirmed the vaginal cuff was intact without defect. The abdomen was irrigated and suctioned. Arista was applied to the vesicouterine peritoneum which was oozing slightly. The pelvis was observed to be hemostatic at 31mHg and 834mg. The robot was undocked and Ropivicaine was introduced into the pelvis. All ports were removed. Indigo carmine was administered.    The port sites were closed with 3-0 vicryl in a subcuticular fashion and covered with skin glue.  The foley catheter was removed. A cystoscope was inserted into the bladder and the bladder was distended with saline. Survey revealed intact bladder and bilateral ureteral jets were appreciated. The bladder was drained and foley reintroduced. A speculum was inserted into the vagina and vaginal cuff appeared intact and hemostatic. A manual exam again confirmed the vaginal cuff was intact without defect.    The patient was awakened from anesthesia and taken to the recovery room in stable condition.   M.Luther RedoMD 03/11/21 8:08 AM

## 2021-03-10 NOTE — Brief Op Note (Signed)
03/10/2021  1:49 PM  PATIENT:  Sandra Vargas  45 y.o. female   PRE-OPERATIVE DIAGNOSIS:  menorrhagia and dysmenorrhea  POST-OPERATIVE DIAGNOSIS:  menorrhagia  PROCEDURE:  Procedure(s): XI ROBOTIC ASSISTED TOTAL HYSTERECTOMY WITH BILATERAL SALPINGECTOMY (Bilateral) CYSTOSCOPY (N/A)  SURGEON:  Surgeon(s) and Role:    * Luchiano Viscomi, Edwinna Areola, MD - Primary    * Taam-Akelman, Lawrence Santiago, MD - Assisting   ANESTHESIA:   general  EBL:  10m  BLOOD ADMINISTERED:none  DRAINS: Urinary Catheter (Foley)   LOCAL MEDICATIONS USED:  BUPIVICAINE   SPECIMEN:  Source of Specimen:  uterus, cervix, bilateral fallopian tubes, filshie clips (x2)  DISPOSITION OF SPECIMEN:  PATHOLOGY  COUNTS:  YES  TOURNIQUET:  * No tourniquets in log *  DICTATION: .Note written in EPIC  PLAN OF CARE: Discharge to home after PACU  PATIENT DISPOSITION:  PACU - hemodynamically stable.   Delay start of Pharmacological VTE agent (>24hrs) due to surgical blood loss or risk of bleeding: not applicable

## 2021-03-10 NOTE — Interval H&P Note (Signed)
History and Physical Interval Note:  03/10/2021 10:51 AM  Franco Collet  has presented today for surgery, with the diagnosis of menorrhagia.  The various methods of treatment have been discussed with the patient and family. After consideration of risks, benefits and other options for treatment, the patient has consented to  Procedure(s): XI ROBOTIC ASSISTED TOTAL HYSTERECTOMY WITH BILATERAL SALPINGECTOMY (Bilateral) CYSTOSCOPY (N/A) as a surgical intervention.  Previous surgery scheduled 02/10/21 was cancelled due to preoperative testing, COVID positive. She is feeling much better now and will proceed with surgery. The patient's history has been reviewed, patient examined, no change in status, stable for surgery.  I have reviewed the patient's chart and labs.  Questions were answered to the patient's satisfaction.     Rowland Lathe

## 2021-03-10 NOTE — Anesthesia Procedure Notes (Signed)
Procedure Name: Intubation Date/Time: 03/10/2021 11:53 AM Performed by: Bonney Aid, CRNA Pre-anesthesia Checklist: Patient identified, Emergency Drugs available, Suction available and Patient being monitored Patient Re-evaluated:Patient Re-evaluated prior to induction Oxygen Delivery Method: Circle system utilized Preoxygenation: Pre-oxygenation with 100% oxygen Induction Type: IV induction Ventilation: Mask ventilation without difficulty Laryngoscope Size: Mac and 3 Grade View: Grade I Tube type: Oral Tube size: 7.5 mm Number of attempts: 1 Airway Equipment and Method: Stylet Placement Confirmation: ETT inserted through vocal cords under direct vision, positive ETCO2 and breath sounds checked- equal and bilateral Secured at: 21 cm Tube secured with: Tape Dental Injury: Teeth and Oropharynx as per pre-operative assessment

## 2021-03-11 ENCOUNTER — Encounter (HOSPITAL_BASED_OUTPATIENT_CLINIC_OR_DEPARTMENT_OTHER): Payer: Self-pay | Admitting: Obstetrics and Gynecology

## 2021-03-13 LAB — SURGICAL PATHOLOGY

## 2021-05-19 ENCOUNTER — Other Ambulatory Visit: Payer: Self-pay | Admitting: Obstetrics and Gynecology

## 2021-05-19 DIAGNOSIS — R928 Other abnormal and inconclusive findings on diagnostic imaging of breast: Secondary | ICD-10-CM

## 2021-06-04 ENCOUNTER — Other Ambulatory Visit: Payer: Self-pay | Admitting: Obstetrics and Gynecology

## 2021-06-04 ENCOUNTER — Other Ambulatory Visit: Payer: Self-pay

## 2021-06-04 ENCOUNTER — Ambulatory Visit
Admission: RE | Admit: 2021-06-04 | Discharge: 2021-06-04 | Disposition: A | Discharge status: Home / Self Care | Payer: BC Managed Care – PPO | Source: Ambulatory Visit | Attending: Obstetrics and Gynecology | Admitting: Obstetrics and Gynecology

## 2021-06-04 DIAGNOSIS — R928 Other abnormal and inconclusive findings on diagnostic imaging of breast: Secondary | ICD-10-CM

## 2021-12-04 ENCOUNTER — Other Ambulatory Visit: Payer: Self-pay | Admitting: Obstetrics and Gynecology

## 2021-12-04 ENCOUNTER — Ambulatory Visit
Admission: RE | Admit: 2021-12-04 | Discharge: 2021-12-04 | Disposition: A | Discharge status: Home / Self Care | Payer: BC Managed Care – PPO | Source: Ambulatory Visit | Attending: Obstetrics and Gynecology | Admitting: Obstetrics and Gynecology

## 2021-12-04 DIAGNOSIS — R928 Other abnormal and inconclusive findings on diagnostic imaging of breast: Secondary | ICD-10-CM

## 2021-12-04 DIAGNOSIS — N6310 Unspecified lump in the right breast, unspecified quadrant: Secondary | ICD-10-CM

## 2022-05-05 ENCOUNTER — Encounter: Payer: Self-pay | Admitting: Emergency Medicine

## 2022-05-05 ENCOUNTER — Ambulatory Visit
Admission: EM | Admit: 2022-05-05 | Discharge: 2022-05-05 | Disposition: A | Discharge status: Home / Self Care | Payer: BC Managed Care – PPO | Attending: Family Medicine | Admitting: Family Medicine

## 2022-05-05 ENCOUNTER — Other Ambulatory Visit: Payer: Self-pay

## 2022-05-05 DIAGNOSIS — H60391 Other infective otitis externa, right ear: Secondary | ICD-10-CM | POA: Diagnosis not present

## 2022-05-05 MED ORDER — CIPROFLOXACIN-DEXAMETHASONE 0.3-0.1 % OT SUSP: 4.0000 [drp] | Freq: Two times a day (BID) | OTIC | 0 refills | Status: AC

## 2022-05-05 NOTE — ED Triage Notes (Signed)
Pt reports right ear pain since Monday. Denies any known injury or known fevers.

## 2022-05-05 NOTE — ED Provider Notes (Signed)
[facility]    CSN: [id number] Arrival date & time: 05/05/22  1721      History   Chief Complaint Chief Complaint  Patient presents with   Otalgia    HPI Sandra Vargas is a 46 y.o. female.   Patient presenting today with 2-day history of right outer ear pain, swelling.  Denies drainage, loss of hearing, recent congestion, history of chronic ear issues.  So far trying some over-the-counter eardrops with no relief.    Past Medical History:  Diagnosis Date   Anxiety    Cancer ([place]) 2012   melanoma area removed fron left calf   Cough 02/25/2021   slight cough with yellow sputum finishes augmentin on 02-26-2021 per pt   COVID 02/2020   sinus infection cough sob fever body aches h/a x 2 weeks all symptoms except slight cough per pt   COVID 02/06/2021   scratch throat pneumonia all symptoms resolved except slight cough with yellow sputum per pt   Dental crown present    Family history of adverse reaction to anesthesia    pt's mother has hx. of post-op N/V   Menorrhagia 02/04/2021   Pneumonia    follow up chest xray done 03-09-2021 left upper lobe clearing   PONV (postoperative nausea and vomiting)    iv meds help with ponv    Patient Active Problem List   Diagnosis Date Noted   Menorrhagia 03/10/2021   Hx of total hysterectomy 03/10/2021   Plantar fasciitis 10/26/2018   Premenstrual tension syndrome 10/16/2018   Skin lesion of right arm 12/15/2016    Past Surgical History:  Procedure Laterality Date   ABDOMINAL HYSTERECTOMY     APPENDECTOMY  2007   laparoscopic   BREAST DUCTAL SYSTEM EXCISION Right 03/14/2018   Procedure: RIGHT BREAST CENTRAL DUCT EXCISION ERAS PATHWAY;  Surgeon: Erroll Luna, MD;  Location: [facility];  Service: General;  Laterality: Right;   CHOLECYSTECTOMY  yrs ago   laparoscopic   CYSTOSCOPY N/A 03/10/2021   Procedure: CYSTOSCOPY;  Surgeon: Rowland Lathe, MD;  Location: [place];   Service: Gynecology;  Laterality: N/A;   DILATION AND EVACUATION  08/14/2009   DILITATION & CURRETTAGE/HYSTROSCOPY WITH NOVASURE ABLATION N/A 10/30/2020   Procedure: DILATATION & CURETTAGE/HYSTEROSCOPY;  Surgeon: Vanessa Kick, MD;  Location: [facility];  Service: Gynecology;  Laterality: N/A;   IUD REMOVAL  10/30/2020   Procedure: INTRAUTERINE DEVICE (IUD) REMOVAL;  Surgeon: Vanessa Kick, MD;  Location: [place];  Service: Gynecology;;   ROBOTIC ASSISTED TOTAL HYSTERECTOMY WITH BILATERAL SALPINGO OOPHERECTOMY Bilateral 03/10/2021   Procedure: XI ROBOTIC ASSISTED TOTAL HYSTERECTOMY WITH BILATERAL SALPINGECTOMY;  Surgeon: Rowland Lathe, MD;  Location: [place];  Service: Gynecology;  Laterality: Bilateral;   TUBAL LIGATION  08/14/2009   WRIST MASS EXCISION Right 11/18/2016    OB History   No obstetric history on file.      Home Medications    Prior to Admission medications   Medication Sig Start Date End Date Taking? Authorizing Provider  ciprofloxacin-dexamethasone (CIPRODEX) OTIC suspension Place 4 drops into the right ear 2 (two) times daily. 05/05/22  Yes Volney American, PA-C  acetaminophen (TYLENOL) 500 MG tablet Take 1,000 mg by mouth every 6 (six) hours as needed.    [provider]  FLUoxetine (PROZAC) 20 MG capsule Take 20 mg by mouth at bedtime.     [provider]  ibuprofen (ADVIL) 600 MG tablet Take 1  tablet (600 mg total) by mouth every 6 (six) hours as needed. 10/30/20   Vanessa Kick, MD  ibuprofen (ADVIL) 600 MG tablet Take 1 tablet (600 mg total) by mouth every 6 (six) hours as needed. 03/10/21   Rowland Lathe, MD  oxyCODONE (OXY IR/ROXICODONE) 5 MG immediate release tablet Take 1 tablet (5 mg total) by mouth every 4 (four) hours as needed for severe pain. 03/10/21   Rowland Lathe, MD    Family History Family History  Problem Relation Age of Onset   Breast cancer Maternal Aunt     Anesthesia problems Mother        post-op N/V    Social History Social History   Tobacco Use   Smoking status: Never   Smokeless tobacco: Never  Vaping Use   Vaping Use: Never used  Substance Use Topics   Alcohol use: No   Drug use: No     Allergies   Codeine   Review of Systems Review of Systems Per HPI  Physical Exam Triage Vital Signs ED Triage Vitals  Enc Vitals Group     BP 05/05/22 1901 (!) 140/87     Pulse Rate 05/05/22 1901 70     Resp 05/05/22 1901 20     Temp 05/05/22 1901 97.9 F (36.6 C)     Temp Source 05/05/22 1901 Oral     SpO2 05/05/22 1901 96 %     Weight --      Height --      Head Circumference --      Peak Flow --      Pain Score 05/05/22 1902 8     Pain Loc --      Pain Edu? --      Excl. in [place]? --    No data found.  Updated Vital Signs BP (!) 140/87 (BP Location: Right Arm)   Pulse 70   Temp 97.9 F (36.6 C) (Oral)   Resp 20   LMP 02/10/2021 (Approximate)   SpO2 96%   Visual Acuity Right Eye Distance:   Left Eye Distance:   Bilateral Distance:    Right Eye Near:   Left Eye Near:    Bilateral Near:     Physical Exam Vitals and nursing note reviewed.  Constitutional:      Appearance: Normal appearance. She is not ill-appearing.  HENT:     Head: Atraumatic.     Left Ear: Tympanic membrane and external ear normal.     Ears:     Comments: Right EAC edematous, erythematous, peeling, significantly tender to palpation Eyes:     Extraocular Movements: Extraocular movements intact.     Conjunctiva/sclera: Conjunctivae normal.  Cardiovascular:     Rate and Rhythm: Normal rate and regular rhythm.     Heart sounds: Normal heart sounds.  Pulmonary:     Effort: Pulmonary effort is normal.     Breath sounds: Normal breath sounds.  Musculoskeletal:        General: Normal range of motion.     Cervical back: Normal range of motion and neck supple.  Skin:    General: Skin is warm and dry.  Neurological:     Mental Status:  She is alert and oriented to person, place, and time.  Psychiatric:        Mood and Affect: Mood normal.        Thought Content: Thought content normal.        Judgment: Judgment normal.  UC Treatments / Results  Labs (all labs ordered are listed, but only abnormal results are displayed) Labs Reviewed - No data to display  EKG   Radiology No results found.  Procedures Procedures (including critical care time)  Medications Ordered in UC Medications - No data to display  Initial Impression / Assessment and Plan / UC Course  I have reviewed the triage vital signs and the nursing notes.  Pertinent labs & imaging results that were available during my care of the patient were reviewed by me and considered in my medical decision making (see chart for details).     Treat with Ciprodex drops, over-the-counter pain relievers as needed.  Return for worsening symptoms.  Final Clinical Impressions(s) / UC Diagnoses   Final diagnoses:  Other infective acute otitis externa of right ear   Discharge Instructions   None    ED Prescriptions     Medication Sig Dispense Auth. Provider   ciprofloxacin-dexamethasone (CIPRODEX) OTIC suspension Place 4 drops into the right ear 2 (two) times daily. 7.5 mL Volney American, [place]      PDMP not reviewed this encounter.   Volney American, [place] 05/05/22 1913

## 2022-06-07 ENCOUNTER — Ambulatory Visit
Admission: RE | Admit: 2022-06-07 | Discharge: 2022-06-07 | Disposition: A | Discharge status: Home / Self Care | Payer: BC Managed Care – PPO | Source: Ambulatory Visit | Attending: Obstetrics and Gynecology | Admitting: Obstetrics and Gynecology

## 2022-06-07 DIAGNOSIS — N6310 Unspecified lump in the right breast, unspecified quadrant: Secondary | ICD-10-CM

## 2023-04-21 ENCOUNTER — Other Ambulatory Visit: Payer: Self-pay | Admitting: Obstetrics and Gynecology

## 2023-04-21 DIAGNOSIS — N6310 Unspecified lump in the right breast, unspecified quadrant: Secondary | ICD-10-CM

## 2023-06-09 ENCOUNTER — Ambulatory Visit
Admission: RE | Admit: 2023-06-09 | Discharge: 2023-06-09 | Disposition: A | Discharge status: Home / Self Care | Payer: BC Managed Care – PPO | Source: Ambulatory Visit | Attending: Obstetrics and Gynecology | Admitting: Obstetrics and Gynecology

## 2023-06-09 DIAGNOSIS — N6310 Unspecified lump in the right breast, unspecified quadrant: Secondary | ICD-10-CM
